# Patient Record
Sex: Male | Born: 1950 | Race: White | Hispanic: No | Marital: Married | Smoking: Never smoker
Health system: Southern US, Community
[De-identification: ages and names within clinical notes are randomized; demographics above are authoritative.]

## PROBLEM LIST (undated history)

## (undated) DIAGNOSIS — I471 Supraventricular tachycardia: Secondary | ICD-10-CM

## (undated) HISTORY — PX: EYE SURGERY: SHX253

## (undated) HISTORY — PX: HERNIA REPAIR: SHX51

---

## 1990-06-28 HISTORY — PX: FETAL SURGERY FOR CONGENITAL HERNIA: SHX1618

## 2010-06-28 DIAGNOSIS — I471 Supraventricular tachycardia, unspecified: Secondary | ICD-10-CM

## 2010-06-28 HISTORY — DX: Supraventricular tachycardia, unspecified: I47.10

## 2010-06-28 HISTORY — DX: Supraventricular tachycardia: I47.1

## 2014-07-29 DEATH — deceased

## 2016-06-17 ENCOUNTER — Emergency Department (HOSPITAL_COMMUNITY)
Admission: EM | Admit: 2016-06-17 | Discharge: 2016-06-17 | Disposition: A | Discharge status: Home / Self Care | Payer: Medicare Other | Attending: Emergency Medicine | Admitting: Emergency Medicine

## 2016-06-17 ENCOUNTER — Encounter (HOSPITAL_COMMUNITY): Payer: Self-pay

## 2016-06-17 DIAGNOSIS — I4891 Unspecified atrial fibrillation: Secondary | ICD-10-CM | POA: Diagnosis not present

## 2016-06-17 DIAGNOSIS — R000 Tachycardia, unspecified: Secondary | ICD-10-CM | POA: Diagnosis present

## 2016-06-17 HISTORY — DX: Supraventricular tachycardia: I47.1

## 2016-06-17 LAB — TSH: TSH: 1.417 u[IU]/mL (ref 0.350–4.500)

## 2016-06-17 LAB — COMPREHENSIVE METABOLIC PANEL
ALT: 19 U/L (ref 17–63)
ANION GAP: 11 (ref 5–15)
AST: 20 U/L (ref 15–41)
Albumin: 4.2 g/dL (ref 3.5–5.0)
Alkaline Phosphatase: 68 U/L (ref 38–126)
CHLORIDE: 99 mmol/L — AB (ref 101–111)
CO2: 28 mmol/L (ref 22–32)
Calcium: 9.8 mg/dL (ref 8.9–10.3)
Creatinine, Ser: 1.04 mg/dL (ref 0.61–1.24)
Glucose, Bld: 110 mg/dL — ABNORMAL HIGH (ref 65–99)
POTASSIUM: 3.8 mmol/L (ref 3.5–5.1)
Sodium: 138 mmol/L (ref 135–145)
Total Bilirubin: 0.9 mg/dL (ref 0.3–1.2)
Total Protein: 6.9 g/dL (ref 6.5–8.1)

## 2016-06-17 LAB — CBC WITH DIFFERENTIAL/PLATELET
BASOS ABS: 0 10*3/uL (ref 0.0–0.1)
Basophils Relative: 0 %
EOS PCT: 0 %
Eosinophils Absolute: 0 10*3/uL (ref 0.0–0.7)
HCT: 41.9 % (ref 39.0–52.0)
Hemoglobin: 14.1 g/dL (ref 13.0–17.0)
LYMPHS PCT: 10 %
Lymphs Abs: 0.8 10*3/uL (ref 0.7–4.0)
MCH: 32.4 pg (ref 26.0–34.0)
MCHC: 33.7 g/dL (ref 30.0–36.0)
MCV: 96.3 fL (ref 78.0–100.0)
MONO ABS: 1.2 10*3/uL — AB (ref 0.1–1.0)
Monocytes Relative: 17 %
Neutro Abs: 5.2 10*3/uL (ref 1.7–7.7)
Neutrophils Relative %: 73 %
PLATELETS: 176 10*3/uL (ref 150–400)
RBC: 4.35 MIL/uL (ref 4.22–5.81)
RDW: 12.6 % (ref 11.5–15.5)
WBC: 7.2 10*3/uL (ref 4.0–10.5)

## 2016-06-17 LAB — MAGNESIUM: MAGNESIUM: 2 mg/dL (ref 1.7–2.4)

## 2016-06-17 MED ORDER — APIXABAN 5 MG PO TABS: 5.0000 mg | ORAL_TABLET | Freq: Two times a day (BID) | ORAL | 0 refills | Status: DC

## 2016-06-17 MED ORDER — APIXABAN 5 MG PO TABS
5.0000 mg | ORAL_TABLET | Freq: Two times a day (BID) | ORAL | Status: DC
  Filled 2016-06-17: qty 1

## 2016-06-17 MED ORDER — METOPROLOL TARTRATE 5 MG/5ML IV SOLN
2.5000 mg | Freq: Once | INTRAVENOUS | Status: AC
  Administered 2016-06-17: 2.5 mg via INTRAVENOUS
  Filled 2016-06-17: qty 5

## 2016-06-17 MED ORDER — SODIUM CHLORIDE 0.9 % IV BOLUS (SEPSIS)
1000.0000 mL | Freq: Once | INTRAVENOUS | Status: AC
  Administered 2016-06-17: 1000 mL via INTRAVENOUS

## 2016-06-17 MED ORDER — APIXABAN 5 MG PO TABS
5.0000 mg | ORAL_TABLET | Freq: Two times a day (BID) | ORAL | Status: DC
  Administered 2016-06-17: 5 mg via ORAL
  Filled 2016-06-17: qty 1

## 2016-06-17 NOTE — ED Triage Notes (Signed)
Per EMS: Pt with history of SVT started feeling "palpitations" yesterday. Wife, who is medical provider check heart pulse and noted irregular heartbeat. Today no change noted. Called 911.

## 2016-06-17 NOTE — Discharge Instructions (Signed)
You have a follow-up appointment at the atrial fibrillation clinic.  [facility] at [facility] [address] [place],  [place]  [id number] Main: [phone]  Your appointment is for 9:30 tomorrow morning. Please call the office number so that they can give you more detailed instructions of [facility] and were to go.  You're started on a new blood thinner medications for stroke prevention while you have this ongoing rhythm. They will further addressed your heart rate control and management at your appointment tomorrow.  Continue taking your metoprolol as scheduled, including your dose tonight  Return without fail for worsening symptoms, including difficulty breathing, passing out, chest pain. We discussed that there are some increased risk for bleeding well on this blood thinner. Please have evaluation in the hospital for any head injury on this medication, bloody stools or black stools, or any other symptoms concerning to you.

## 2016-06-17 NOTE — ED Provider Notes (Signed)
I saw and evaluated the patient, reviewed the resident's note and I agree with the findings and plan.   EKG Interpretation  Date/Time:  Thursday June 17 2016 14:23:16 EST Ventricular Rate:  115 PR Interval:    QRS Duration: 104 QT Interval:  320 QTC Calculation: 443 R Axis:   -81 Text Interpretation:  Age not entered, assumed to be  65 years old for purpose of ECG interpretation Atrial fibrillation Left anterior fascicular block Anterior infarct, old no prior history of atrial fibrillation  Confirmed by Areana Kosanke MD, Lala Been [phone]) on 06/17/2016 2:34:4735 PM      65 year old male who presents with palpitations and weakness. States history of SVT well controlled by metoprolol. Has been dealing with sinus infection and last night took albuterol inhaler and single dose of cough medication. Did feel like he was weak and had palpitations and irregular heart beat at around 8PM. Took dose of metoprolol, heart beat slowed down. This morning with persistent symptoms even after taking metoprolol. No chest pain or difficulty breathing. No leg swelling, leg pain, fever or chills. Does state occasional palpations here and there in the past.   On arrival with atrial fibrllation with RVR at rate 110-140s. Low BP 90-100s SBP but he states that it is his baseline. Exam otherwise non-focal. No major electrolyte or metabolic derangements. Normal TSH. Did receive 2.5 mg IV metoprolol and adequately rate controlled and he appears well. HR 90-110. No need for IV drip and I feel he is appropriate for outpatient management. Not great candidate for cardioversion has had occasional palpitations in the past.  Appointment made for him for tomorrow at 9:30AM for Afib clinic. Discussed with Dr. Elease HashimotoNahser from cardiology. Agrees with outpatient management. Given Chads2-vasc score of 1 recommending Eliquis, which we will start. Strict return and follow-up instructions reviewed. Patient and his wife expressed understanding of all discharge  instructions and felt comfortable with the plan of care.    Lavera Guiseana Duo Janijah Symons, MD 06/17/16 [phone]

## 2016-06-17 NOTE — ED Provider Notes (Signed)
[facility]-EMERGENCY DEPT Provider Note   CSN: [id number] Arrival date & time: 06/17/16  1413     History   Chief Complaint Chief Complaint  Patient presents with  . Weakness  . Irregular Heart Beat    HPI Darius Williamson is a 65 y.o. male.  HPI The patient has a h/o SVT (with no issues on metoprolol for several years) presenting with tachycardia and an irregular heart beat.  He notes palpitations are constant since last night. No chest pain. He tried valsalva which didn't help. He's been taking metoprolol 25mg  BID without improvement in his symptoms. He denies SOB, LE swelling.   The patient has been trying to get rid of a sinus infection and took cough medication, Omnicef, and 1 dose of Tamiflu which he attributes to his current symptoms.   He is unsure if he's had palpitations before yesterday as he tends to brush off symptoms. 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   PCP: [facility] (has only had 1 appt)  Past Medical History:  Diagnosis Date  . SVT (supraventricular tachycardia) (HCC) 2012    There are no active problems to display for this patient.   Past Surgical History:  Procedure Laterality Date  . EYE SURGERY    . HERNIA REPAIR       Home Medications    Prior to Admission medications   Not on File  metoprolol tartrate 25mg  BID  Family History No family history on file.  Social History Social History  Substance Use Topics  . Smoking status: Never Smoker  . Smokeless tobacco: Never Used  . Alcohol use 0.6 oz/week    1 Glasses of wine per week     Allergies   Patient has no known allergies.   Review of Systems Review of Systems  Constitutional: Negative for activity change, appetite change, chills and fever.  HENT: Positive for congestion, postnasal drip, sinus pain and sinus pressure. Negative for ear discharge, rhinorrhea, sore throat and trouble swallowing.   Eyes: Negative.   Respiratory: Positive for cough. Negative  for shortness of breath, wheezing and stridor.   Cardiovascular: Positive for palpitations. Negative for chest pain and leg swelling.  Musculoskeletal: Positive for neck stiffness.  Skin: Negative for rash.  Neurological: Positive for headaches.  All other systems reviewed and are negative.    Physical Exam Updated Vital Signs BP 104/65   Pulse 96   Resp 21   Ht 5\' 7"  (1.702 m)   Wt 59 kg   SpO2 99%   BMI 20.36 kg/m   Physical Exam  Constitutional: He is oriented to person, place, and time. He appears well-developed and well-nourished. No distress.  HENT:  Head: Normocephalic and atraumatic.  Right Ear: External ear normal.  Left Ear: External ear normal.  Nose: Nose normal.  Mouth/Throat: Oropharynx is clear and moist. No oropharyngeal exudate.  Eyes: Conjunctivae are normal. Right eye exhibits no discharge. Left eye exhibits no discharge. No scleral icterus.  Neck: Normal range of motion. Neck supple. No JVD present.  Cardiovascular: Normal heart sounds and intact distal pulses.  Exam reveals no gallop and no friction rub.   No murmur heard. Tachycardic, irregular rhythm  Pulmonary/Chest: Effort normal and breath sounds normal. No respiratory distress. He has no wheezes. He has no rales. He exhibits no tenderness.  Abdominal: Soft. Bowel sounds are normal. He exhibits no distension. There is no tenderness.  Musculoskeletal: He exhibits no edema or tenderness.  Lymphadenopathy:    He  has no cervical adenopathy.  Neurological: He is alert and oriented to person, place, and time. He exhibits normal muscle tone.  Skin: Skin is warm. Capillary refill takes less than 2 seconds. No rash noted. He is not diaphoretic.  Psychiatric: He has a normal mood and affect. His behavior is normal.     ED Treatments / Results  Labs (all labs ordered are listed, but only abnormal results are displayed) Labs Reviewed  CBC WITH DIFFERENTIAL/PLATELET  COMPREHENSIVE METABOLIC PANEL  TSH    MAGNESIUM    EKG  EKG Interpretation  Date/Time:  Thursday June 17 2016 14:23:16 EST Ventricular Rate:  115 PR Interval:    QRS Duration: 104 QT Interval:  320 QTC Calculation: 443 R Axis:   -81 Text Interpretation:  Age not entered, assumed to be  65 years old for purpose of ECG interpretation Atrial fibrillation Left anterior fascicular block Anterior infarct, old no prior history of atrial fibrillation  Confirmed by LIU MD, DANA ([id number]) on 06/17/2016 2:34:35 PM       Radiology No results found.  Procedures Procedures (including critical care time)  Medications Ordered in ED Medications  metoprolol (LOPRESSOR) injection 2.5 mg (not administered)  sodium chloride 0.9 % bolus 1,000 mL (not administered)     Initial Impression / Assessment and Plan / ED Course  I have reviewed the triage vital signs and the nursing notes.  Pertinent labs & imaging results that were available during my care of the patient were reviewed by me and considered in my medical decision making (see chart for details).  Clinical Course    1500: patient noted to be in atrial fibrillation with HR in the 110-140s. Will get CMET, TSH, magnesium and CBC. No chest pain, therefore will defer troponin. Will give a 1 time dose of metoprolol 2.5mg  and evaluate response. CHADVASc 2 score is 1 which is low/moderate risk. Will consider outpatient anticoagulation as this is new onset afib.  If we are able to rate control him in the ED, he may be able to f/u with afib clinic tomorrow at 9:30AM. The patient was discussed with EDP attending, Dr. Joni FearsLui, who will resume care.   Final Clinical Impressions(s) / ED Diagnoses   Final diagnoses:  Atrial fibrillation with RVR [facility](HCC)    New Prescriptions New Prescriptions   No medications on file     Joanna Puffrystal S Friend Dorfman, MD 06/17/16 1518    Lavera Guiseana Duo Liu, MD 06/18/16 1109

## 2016-06-18 ENCOUNTER — Encounter (HOSPITAL_COMMUNITY): Payer: Self-pay | Admitting: Nurse Practitioner

## 2016-06-18 ENCOUNTER — Ambulatory Visit (HOSPITAL_COMMUNITY)
Admit: 2016-06-18 | Discharge: 2016-06-18 | Disposition: A | Discharge status: Home / Self Care | Payer: Medicare Other | Source: Ambulatory Visit | Attending: Nurse Practitioner | Admitting: Nurse Practitioner

## 2016-06-18 VITALS — BP 122/74 | HR 65 | Ht 67.0 in | Wt 133.2 lb

## 2016-06-18 DIAGNOSIS — Z79899 Other long term (current) drug therapy: Secondary | ICD-10-CM | POA: Diagnosis not present

## 2016-06-18 DIAGNOSIS — I480 Paroxysmal atrial fibrillation: Secondary | ICD-10-CM | POA: Insufficient documentation

## 2016-06-18 NOTE — Patient Instructions (Signed)
Your physician has recommended you make the following change in your medication: 1) May take an extra metoprolol as needed for afib

## 2016-06-18 NOTE — Progress Notes (Signed)
Primary Care Physician: Peter CongoBarden, [name], PA-C Referring Physician: [facility]   Linda HedgesWilliam Williamson is a 65 y.o. male in the afib clinic for evaluation. He was seen in the ER for new onset afib that occurred after using albuterol for URI symptoms. He did leave the ER in rate controlled afib and did convert shortly afterward. He was started on eliquis 5 mg bid for a CHA2DS2VASc score of 1(age).   Review of lifestyle shows that pt is of normal weight, he exercises regularly, no smoking or alcohol, minimal caffeine. No snoring.   Today, he denies symptoms of palpitations, chest pain, shortness of breath, orthopnea, PND, lower extremity edema, dizziness, presyncope, syncope, or neurologic sequela. The patient is tolerating medications without difficulties and is otherwise without complaint today.   Past Medical History:  Diagnosis Date  . SVT (supraventricular tachycardia) (HCC) 2012   Past Surgical History:  Procedure Laterality Date  . EYE SURGERY    . HERNIA REPAIR      Current Outpatient Prescriptions  Medication Sig Dispense Refill  . apixaban (ELIQUIS) 5 MG TABS tablet Take 1 tablet (5 mg total) by mouth 2 (two) times daily. 60 tablet 0  . cefdinir (OMNICEF) 300 MG capsule Take 300 mg by mouth 2 (two) times daily.    . Cholecalciferol (VITAMIN D PO) Take 1 tablet by mouth daily.    Marland Kitchen. HYDROcodone-homatropine (HYCODAN) 5-1.5 MG/5ML syrup Take 5 mLs by mouth every 6 (six) hours as needed for cough.    . metoprolol tartrate (LOPRESSOR) 25 MG tablet Take 25 mg by mouth 2 (two) times daily.    . sildenafil (REVATIO) 20 MG tablet Take 20 mg by mouth daily as needed. Erectile dysfunction     No current facility-administered medications for this encounter.     No Known Allergies  Social History   Social History  . Marital status: Married    Spouse name: N/A  . Number of children: N/A  . Years of education: N/A   Occupational History  . Not on file.   Social History Main Topics  .  Smoking status: Never Smoker  . Smokeless tobacco: Never Used  . Alcohol use 0.6 oz/week    1 Glasses of wine per week  . Drug use: No  . Sexual activity: Not on file   Other Topics Concern  . Not on file   Social History Narrative  . No narrative on file    No family history on file.  ROS- All systems are reviewed and negative except as per the HPI above  Physical Exam: Vitals:   06/18/16 0946  BP: 122/74  Pulse: 65  Weight: 133 lb 3.2 oz (60.4 kg)  Height: 5\' 7"  (1.702 m)   Wt Readings from Last 3 Encounters:  06/18/16 133 lb 3.2 oz (60.4 kg)  06/17/16 130 lb (59 kg)    Labs: Lab Results  Component Value Date   NA 138 06/17/2016   K 3.8 06/17/2016   CL 99 (L) 06/17/2016   CO2 28 06/17/2016   GLUCOSE 110 (H) 06/17/2016   BUN <5 (L) 06/17/2016   CREATININE 1.04 06/17/2016   CALCIUM 9.8 06/17/2016   MG 2.0 06/17/2016   No results found for: INR No results found for: CHOL, HDL, LDLCALC, TRIG   GEN- The patient is well appearing, alert and oriented x 3 today.   Head- normocephalic, atraumatic Eyes-  Sclera clear, conjunctiva pink Ears- hearing intact Oropharynx- clear Neck- supple, no JVP Lymph- no cervical lymphadenopathy  Lungs- Clear to ausculation bilaterally, normal work of breathing Heart- Regular rate and rhythm, no murmurs, rubs or gallops, PMI not laterally displaced GI- soft, NT, ND, + BS Extremities- no clubbing, cyanosis, or edema MS- no significant deformity or atrophy Skin- no rash or lesion Psych- euthymic mood, full affect Neuro- strength and sensation are intact  EKG-NSR at 65 bpm, pr int 196 ms, qrs int 94 ms, qtc 407 ms Epic records reviewed   Assessment and Plan: 1. Paroxysmal afib Now back in SR Will take blood thinners for 30 days and if no recurrent afib , can stop, for CHA2DS2VASc socre of 1(age) General afib education was given and triggers reviewed Echo ordered  F/u in afib clinic after echo  Lupita LeashDonna C. Matthew Folksarroll,  ANP-C Afib Clinic [facility] [address] [place], [place] [address] [phone]

## 2016-06-30 ENCOUNTER — Ambulatory Visit (HOSPITAL_COMMUNITY)
Admission: RE | Admit: 2016-06-30 | Discharge: 2016-06-30 | Disposition: A | Discharge status: Home / Self Care | Payer: Medicare Other | Source: Ambulatory Visit | Attending: Nurse Practitioner | Admitting: Nurse Practitioner

## 2016-06-30 DIAGNOSIS — I480 Paroxysmal atrial fibrillation: Secondary | ICD-10-CM | POA: Insufficient documentation

## 2016-06-30 NOTE — Progress Notes (Signed)
  Echocardiogram 2D Echocardiogram has been performed.  Darius Williamson 06/30/2016, 3:08 PM

## 2016-07-06 ENCOUNTER — Ambulatory Visit (HOSPITAL_COMMUNITY)
Admission: RE | Admit: 2016-07-06 | Discharge: 2016-07-06 | Disposition: A | Discharge status: Home / Self Care | Payer: Medicare Other | Source: Ambulatory Visit | Attending: Nurse Practitioner | Admitting: Nurse Practitioner

## 2016-07-06 ENCOUNTER — Encounter (HOSPITAL_COMMUNITY): Payer: Self-pay | Admitting: Nurse Practitioner

## 2016-07-06 VITALS — BP 124/70 | HR 55 | Ht 67.0 in | Wt 131.6 lb

## 2016-07-06 DIAGNOSIS — Z79899 Other long term (current) drug therapy: Secondary | ICD-10-CM | POA: Diagnosis not present

## 2016-07-06 DIAGNOSIS — I480 Paroxysmal atrial fibrillation: Secondary | ICD-10-CM | POA: Diagnosis not present

## 2016-07-06 DIAGNOSIS — Z7901 Long term (current) use of anticoagulants: Secondary | ICD-10-CM | POA: Insufficient documentation

## 2016-07-06 DIAGNOSIS — R001 Bradycardia, unspecified: Secondary | ICD-10-CM | POA: Diagnosis present

## 2016-07-06 NOTE — Progress Notes (Signed)
Primary Care Physician: Peter Congo Referring Physician: [facility]   Darius Williamson is a 66 y.o. male in the afib clinic for evaluation. He was seen in the ER for new onset afib that occurred after using albuterol for URI symptoms. He did leave the ER in rate controlled afib and did convert shortly afterward, in afib less than 24 hours.. He was started on eliquis 5 mg bid for a CHA2DS2VASc score of 1(age). Review of lifestyle shows that pt is of normal weight, he exercises regularly, no smoking or alcohol, minimal caffeine. No snoring.  He returns today. 1/9, for f/u  Echo which showed normal EF and no structural abnormalities. He has had not had any further episodes of afib. He will stop Doac today since he has a chadsvasc of 1, afib less than 24 hours, and no recurrent afib.  Today, he denies symptoms of palpitations, chest pain, shortness of breath, orthopnea, PND, lower extremity edema, dizziness, presyncope, syncope, or neurologic sequela. The patient is tolerating medications without difficulties and is otherwise without complaint today.   Past Medical History:  Diagnosis Date  . SVT (supraventricular tachycardia) (HCC) 2012   Past Surgical History:  Procedure Laterality Date  . EYE SURGERY    . HERNIA REPAIR      Current Outpatient Prescriptions  Medication Sig Dispense Refill  . apixaban (ELIQUIS) 5 MG TABS tablet Take 1 tablet (5 mg total) by mouth 2 (two) times daily. 60 tablet 0  . Cholecalciferol (VITAMIN D PO) Take 1 tablet by mouth daily.    . fexofenadine (ALLEGRA) 180 MG tablet Take 180 mg by mouth daily.    . metoprolol tartrate (LOPRESSOR) 25 MG tablet Take 25 mg by mouth 2 (two) times daily.    . sildenafil (REVATIO) 20 MG tablet Take 20 mg by mouth daily as needed. Erectile dysfunction     No current facility-administered medications for this encounter.     No Known Allergies  Social History   Social History  . Marital status: Married    Spouse name:  N/A  . Number of children: N/A  . Years of education: N/A   Occupational History  . Not on file.   Social History Main Topics  . Smoking status: Never Smoker  . Smokeless tobacco: Never Used  . Alcohol use 0.6 oz/week    1 Glasses of wine per week  . Drug use: No  . Sexual activity: Not on file   Other Topics Concern  . Not on file   Social History Narrative  . No narrative on file    No family history on file.  ROS- All systems are reviewed and negative except as per the HPI above  Physical Exam: Vitals:   07/06/16 1334  BP: 124/70  Pulse: (!) 55  Weight: 131 lb 9.6 oz (59.7 kg)  Height: 5\' 7"  (1.702 m)   Wt Readings from Last 3 Encounters:  07/06/16 131 lb 9.6 oz (59.7 kg)  06/18/16 133 lb 3.2 oz (60.4 kg)  06/17/16 130 lb (59 kg)    Labs: Lab Results  Component Value Date   NA 138 06/17/2016   K 3.8 06/17/2016   CL 99 (L) 06/17/2016   CO2 28 06/17/2016   GLUCOSE 110 (H) 06/17/2016   BUN <5 (L) 06/17/2016   CREATININE 1.04 06/17/2016   CALCIUM 9.8 06/17/2016   MG 2.0 06/17/2016   No results found for: INR No results found for: CHOL, HDL, LDLCALC, TRIG   GEN-  The patient is well appearing, alert and oriented x 3 today.   Head- normocephalic, atraumatic Eyes-  Sclera clear, conjunctiva pink Ears- hearing intact Oropharynx- clear Neck- supple, no JVP Lymph- no cervical lymphadenopathy Lungs- Clear to ausculation bilaterally, normal work of breathing Heart- Regular rate and rhythm, no murmurs, rubs or gallops, PMI not laterally displaced GI- soft, NT, ND, + BS Extremities- no clubbing, cyanosis, or edema MS- no significant deformity or atrophy Skin- no rash or lesion Psych- euthymic mood, full affect Neuro- strength and sensation are intact  EKG- Sinus brady at 55 bpm with first degree AV block, pr int 226ms, qrs itn 102 ms, qtc 413 ms Epic records reviewed Echo-Study Conclusions  - Left ventricle: The cavity size was normal. Systolic  function was   normal. The estimated ejection fraction was in the range of 55%   to 60%. Wall motion was normal; there were no regional wall   motion abnormalities. Left ventricular diastolic function   parameters were normal.   Assessment and Plan: 1. Paroxysmal afib for less than 24 hours, converted spontaneously Possibly induced by use of albuterol Maintaining SR Pt would like to stop DOAC and with low chadsvasc score  for  of 1(age), can stop, took x 19 days General afib education was given and triggers reviewed Echo  Reviewed with pt  F/u in afib clinic as needed  Lupita LeashDonna C. Matthew Folksarroll, ANP-C Afib Clinic [facility] [address] [place], [place] [address] [phone]

## 2021-01-06 ENCOUNTER — Other Ambulatory Visit: Payer: Self-pay

## 2021-01-06 ENCOUNTER — Ambulatory Visit (HOSPITAL_COMMUNITY)
Admission: RE | Admit: 2021-01-06 | Discharge: 2021-01-06 | Disposition: A | Discharge status: Home / Self Care | Payer: Medicare Other | Source: Ambulatory Visit | Attending: Physician Assistant | Admitting: Physician Assistant

## 2021-01-06 ENCOUNTER — Encounter (HOSPITAL_COMMUNITY): Payer: Self-pay | Admitting: Physician Assistant

## 2021-01-06 VITALS — BP 110/60 | HR 86 | Ht 67.0 in | Wt 130.2 lb

## 2021-01-06 DIAGNOSIS — Z79899 Other long term (current) drug therapy: Secondary | ICD-10-CM | POA: Diagnosis not present

## 2021-01-06 DIAGNOSIS — I4819 Other persistent atrial fibrillation: Secondary | ICD-10-CM | POA: Diagnosis present

## 2021-01-06 DIAGNOSIS — Z7901 Long term (current) use of anticoagulants: Secondary | ICD-10-CM | POA: Insufficient documentation

## 2021-01-06 MED ORDER — APIXABAN 5 MG PO TABS
5.0000 mg | ORAL_TABLET | Freq: Two times a day (BID) | ORAL | 3 refills | Status: DC
Start: 2021-01-06 — End: 2021-04-01

## 2021-01-06 NOTE — Progress Notes (Signed)
Primary Care Physician: Romie Jumper, PA-C Primary Cardiologist: none Primary Electrophysiologist: none Referring Physician: [facility]   Darius Williamson is a 70 y.o. male with a history of atrial fibrillation who presents for consultation in the [facility].  The patient was initially diagnosed with atrial fibrillation in 2017 after presenting to ED with symptoms of palpitations and weakness. He has a CHADS2VASC score of 1. Over the years, he has had infrequent afib which lasted < 1 day. But lately his afib has been more persistent, the longest lasting several weeks. He reports today that this present episode started 12/17/20. There are no specific triggers that he can identify. He denies significant snoring or alcohol use. He does have symptoms of palpitations and fatigue while in afib.   Today, he denies symptoms of chest pain, shortness of breath, orthopnea, PND, lower extremity edema, dizziness, presyncope, syncope, snoring, daytime somnolence, bleeding, or neurologic sequela. The patient is tolerating medications without difficulties and is otherwise without complaint today.    Atrial Fibrillation Risk Factors:  he does not have symptoms or diagnosis of sleep apnea. he does not have a history of rheumatic fever. he does not have a history of alcohol use. The patient does not have a history of early familial atrial fibrillation or other arrhythmias.  he has a BMI of Body mass index is 20.39 kg/m.Marland Kitchen Filed Weights   01/06/21 1027  Weight: 59.1 kg    History reviewed. No pertinent family history.   Atrial Fibrillation Management history:  Previous antiarrhythmic drugs: none Previous cardioversions: none Previous ablations: none CHADS2VASC score: 1 Anticoagulation history: Eliquis   Past Medical History:  Diagnosis Date   SVT (supraventricular tachycardia) (HCC) 2012   Past Surgical History:  Procedure Laterality Date   EYE SURGERY      HERNIA REPAIR      Current Outpatient Medications  Medication Sig Dispense Refill   ascorbic acid (VITAMIN C) 1000 MG tablet Take by mouth.     Cholecalciferol 100 MCG (4000 UT) CAPS Take 5,000 Units by mouth.     fexofenadine (ALLEGRA) 180 MG tablet Take 180 mg by mouth daily.     Magnesium 250 MG TABS Take by mouth.     metoprolol tartrate (LOPRESSOR) 25 MG tablet Take 25 mg by mouth 2 (two) times daily.     Multiple Vitamin (MULTI-VITAMIN) tablet Take by mouth.     sildenafil (REVATIO) 20 MG tablet Take 20 mg by mouth daily as needed. Erectile dysfunction     apixaban (ELIQUIS) 5 MG TABS tablet Take 1 tablet (5 mg total) by mouth 2 (two) times daily. 60 tablet 3   No current facility-administered medications for this encounter.    No Known Allergies  Social History   Socioeconomic History   Marital status: Married    Spouse name: Not on file   Number of children: Not on file   Years of education: Not on file   Highest education level: Not on file  Occupational History   Not on file  Tobacco Use   Smoking status: Never   Smokeless tobacco: Never  Substance and Sexual Activity   Alcohol use: Not Currently    Alcohol/week: 2.0 standard drinks    Types: 2 Cans of beer per week    Comment: 2 beers a week   Drug use: No   Sexual activity: Not on file  Other Topics Concern   Not on file  Social History Narrative   Not  on file   Social Determinants of Health   Financial Resource Strain: Not on file  Food Insecurity: Not on file  Transportation Needs: Not on file  Physical Activity: Not on file  Stress: Not on file  Social Connections: Not on file  Intimate Partner Violence: Not on file     ROS- All systems are reviewed and negative except as per the HPI above.  Physical Exam: Vitals:   01/06/21 1027  BP: 110/60  Pulse: 86  Weight: 59.1 kg  Height: 5\' 7"  (1.702 m)    GEN- The patient is a well appearing male, alert and oriented x 3 today.   Head-  normocephalic, atraumatic Eyes-  Sclera clear, conjunctiva pink Ears- hearing intact Oropharynx- clear Neck- supple  Lungs- Clear to ausculation bilaterally, normal work of breathing Heart- irregular rate and rhythm, no murmurs, rubs or gallops  GI- soft, NT, ND, + BS Extremities- no clubbing, cyanosis, or edema MS- no significant deformity or atrophy Skin- no rash or lesion Psych- euthymic mood, full affect Neuro- strength and sensation are intact  Wt Readings from Last 3 Encounters:  01/06/21 59.1 kg  07/06/16 59.7 kg  06/18/16 60.4 kg    EKG today demonstrates  Afib, LAFB Vent. rate 86 BPM PR interval * ms QRS duration 92 ms QT/QTcB 368/440 ms  Echo 06/30/16 demonstrated  - Left ventricle: The cavity size was normal. Systolic function was    normal. The estimated ejection fraction was in the range of 55%    to 60%. Wall motion was normal; there were no regional wall    motion abnormalities. Left ventricular diastolic function    parameters were normal.   Epic records are reviewed at length today  CHA2DS2-VASc Score = 1  The patient's score is based upon: CHF History: No HTN History: No Diabetes History: No Stroke History: No Vascular Disease History: No Age Score: 1 Gender Score: 0     ASSESSMENT AND PLAN: 1. Persistent Atrial Fibrillation (ICD10:  I48.19) The patient's CHA2DS2-VASc score is 1, indicating a 0.6% annual risk of stroke.   General education about afib provided and questions answered. We also discussed his stroke risk and the risks and benefits of anticoagulation. Patient now appears persistent. We discussed therapeutic options today including AAD, DCCV, and ablation. He declines DCCV. He is agreeable to referral for front-line ablation.  Will start Eliquis 5 mg BID, stop ASA Continue Lopressor 25 mg BID    Follow up with EP for ablation consideration.    08/28/16 PA-C Afib Clinic [facility] [address] [place],  [place] [phone] 01/06/2021 11:29 AM

## 2021-01-06 NOTE — Patient Instructions (Signed)
Stop aspirin  Start Eliquis 5 mg twice a day.

## 2021-01-18 ENCOUNTER — Emergency Department (HOSPITAL_COMMUNITY): Payer: Medicare Other

## 2021-01-18 ENCOUNTER — Emergency Department (HOSPITAL_COMMUNITY)
Admission: EM | Admit: 2021-01-18 | Discharge: 2021-01-19 | Disposition: A | Discharge status: Home / Self Care | Payer: Medicare Other | Attending: Emergency Medicine | Admitting: Emergency Medicine

## 2021-01-18 DIAGNOSIS — Z5321 Procedure and treatment not carried out due to patient leaving prior to being seen by health care provider: Secondary | ICD-10-CM | POA: Insufficient documentation

## 2021-01-18 DIAGNOSIS — Z7901 Long term (current) use of anticoagulants: Secondary | ICD-10-CM | POA: Insufficient documentation

## 2021-01-18 DIAGNOSIS — I4891 Unspecified atrial fibrillation: Secondary | ICD-10-CM | POA: Diagnosis not present

## 2021-01-18 LAB — CBC
HCT: 45.4 % (ref 39.0–52.0)
Hemoglobin: 14.9 g/dL (ref 13.0–17.0)
MCH: 32.7 pg (ref 26.0–34.0)
MCHC: 32.8 g/dL (ref 30.0–36.0)
MCV: 99.6 fL (ref 80.0–100.0)
Platelets: 221 10*3/uL (ref 150–400)
RBC: 4.56 MIL/uL (ref 4.22–5.81)
RDW: 12.5 % (ref 11.5–15.5)
WBC: 6.6 10*3/uL (ref 4.0–10.5)
nRBC: 0 % (ref 0.0–0.2)

## 2021-01-18 LAB — BASIC METABOLIC PANEL
Anion gap: 8 (ref 5–15)
BUN: 10 mg/dL (ref 8–23)
CO2: 29 mmol/L (ref 22–32)
Calcium: 10.2 mg/dL (ref 8.9–10.3)
Chloride: 100 mmol/L (ref 98–111)
Creatinine, Ser: 1.23 mg/dL (ref 0.61–1.24)
GFR, Estimated: >60 mL/min (ref >60)
Glucose, Bld: 84 mg/dL (ref 70–99)
Potassium: 5.1 mmol/L (ref 3.5–5.1)
Sodium: 137 mmol/L (ref 135–145)

## 2021-01-18 NOTE — ED Provider Notes (Signed)
Emergency Medicine Provider Triage Evaluation Note  Darius Williamson , a 70 y.o. male  was evaluated in triage.  Pt complains of episode of heart racing tonight.  Patient has a history of A. fib, has been intermittent until more recently where it has been more persistent.  Patient followed up with his cardiologist about 2 weeks ago and restarted his Eliquis, has been consistently taking since that time with no missed doses.  Also taking metoprolol.  Patient had an episode tonight when his heart started racing right after dinner, he took an extra metoprolol which seemed to help for about 5 minutes and then felt his heart was racing again.  Did not check his rate during this time.  Patient decided to call an ambulance when he felt short of breath and like he was about to pass out.  No loss of consciousness, no chest pain.  Review of Systems  Positive: SHOB, palpitations  Negative: Syncope, CP  Physical Exam  BP 111/76 (BP Location: Right Arm)   Pulse 82   Temp 98.8 F (37.1 C) (Oral)   Resp 18   SpO2 100%  Gen:   Awake, no distress   Resp:  Normal effort  MSK:   Moves extremities without difficulty  Other:  Irregular rate  Medical Decision Making  Medically screening exam initiated at 10:36 PM.  Appropriate orders placed.  Darius Williamson was informed that the remainder of the evaluation will be completed by another provider, this initial triage assessment does not replace that evaluation, and the importance of remaining in the ED until their evaluation is complete.     Darius Fend, PA-C 01/18/21 2243    [name], MD 01/18/21 [phone]

## 2021-01-18 NOTE — ED Triage Notes (Signed)
Pt from home and was watching TV tonight when he started feeling palpitations. Hx of afib, has had preliminary conversation about ablation. Pt took his normal dose of metoprolol and then an additional dose when his heart rate became increased. Pt also on Eliquis

## 2021-01-19 ENCOUNTER — Ambulatory Visit (HOSPITAL_COMMUNITY): Payer: Medicare Other | Admitting: Physician Assistant

## 2021-01-19 NOTE — ED Notes (Signed)
Pt not responding to vital recheck  

## 2021-01-19 NOTE — ED Notes (Signed)
Pt not responding for vital recheck. 

## 2021-01-19 NOTE — Progress Notes (Signed)
Electrophysiology Office Note   Date:  01/20/2021   ID:  Darius Williamson, DOB Jul 12, 1950, MRN [id number]  PCP:  Randel Pigg, Dorma Russell, MD  Cardiologist:   Primary Electrophysiologist:  Min Collymore Jorja Loa, MD    Chief Complaint: AF   History of Present Illness: Darius Williamson is a 70 y.o. male who is being seen today for the evaluation of AF at the request of Barden, [name], PA-C. Presenting today for electrophysiology evaluation.  He has a history significant for atrial fibrillation.  He presented initially in 2017 to the emergency room with palpitations and weakness.  Over the years he has had infrequent episodes, but more recently, episodes have become persistent, lasting for several weeks.  There are no specific triggers that he can identify.  He denies significant alcohol abuse or snoring.  He has symptoms of palpitations and fatigue when he is in atrial fibrillation.  Today, he denies symptoms of palpitations, chest pain, orthopnea, PND, lower extremity edema, claudication, dizziness, presyncope, syncope, bleeding, or neurologic sequela. The patient is tolerating medications without difficulties.  His atrial fibrillation symptoms are weakness, fatigue, shortness of breath, and palpitations.  At this point, he would like to avoid further medications and would prefer ablation.   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 60 tablet 3   ascorbic acid (VITAMIN C) 1000 MG tablet Take by mouth.     Cholecalciferol 100 MCG (4000 UT) CAPS Take 5,000 Units by mouth.     fexofenadine (ALLEGRA) 180 MG tablet Take 180 mg by mouth daily.      Magnesium 250 MG TABS Take by mouth.     metoprolol tartrate (LOPRESSOR) 25 MG tablet Take 25 mg by mouth 2 (two) times daily.     Multiple Vitamin (MULTI-VITAMIN) tablet Take by mouth.     sildenafil (REVATIO) 20 MG tablet Take 20 mg by mouth daily as needed. Erectile dysfunction     No current facility-administered medications for this visit.    Allergies:   Patient has no known allergies.   Social History:  The patient  reports that he has never smoked. He has never used smokeless tobacco. He reports previous alcohol use of about 2.0 standard drinks of alcohol per week. He reports that he does not use drugs.   Family History:  The patient's family history includes Stroke in his mother.    ROS:  Please see the history of present illness.   Otherwise, review of systems is positive for none.   All other systems are reviewed and negative.    PHYSICAL EXAM: VS:  BP 100/90 (BP Location: Left Arm, Patient Position: Sitting, Cuff Size: Normal)   Pulse 66   Ht 5\' 7"  (1.702 m)   Wt 131 lb (59.4 kg)   BMI 20.52 kg/m  , BMI Body mass index is 20.52 kg/m. GEN: Well nourished, well developed, in no acute distress  HEENT: normal  Neck: no JVD, carotid bruits, or masses Cardiac: irregular; no murmurs, rubs, or gallops,no edema  Respiratory:  clear to auscultation bilaterally, normal work of breathing GI: soft, nontender, nondistended, + BS MS:  no deformity or atrophy  Skin: warm and dry Neuro:  Strength and sensation are intact Psych: euthymic mood, full affect  EKG:  EKG is not ordered today. Personal review of the ekg ordered 01/18/21 shows atrial fibrillation  Recent Labs: 01/18/2021: BUN 10; Creatinine, Ser 1.23; Hemoglobin 14.9; Platelets 221; Potassium 5.1; Sodium 137    Lipid Panel  No results found for: CHOL, TRIG, HDL, CHOLHDL, VLDL, LDLCALC, LDLDIRECT   Wt Readings from Last 3 Encounters:  01/20/21 131 lb (59.4 kg)  01/06/21 130 lb 3.2 oz (59.1 kg)  07/06/16 131 lb 9.6  oz (59.7 kg)      Other studies Reviewed: Additional studies/ records that were reviewed today include: TTE 06/30/16  Review of the above records today demonstrates:  - Left ventricle: The cavity size was normal. Systolic function was    normal. The estimated ejection fraction was in the range of 55%    to 60%. Wall motion was normal; there were no regional wall    motion abnormalities. Left ventricular diastolic function    parameters were normal.  -Left atrium:  The atrium was normal in size.  ASSESSMENT AND PLAN:  1.  Persistent atrial fibrillation: CHA2DS2-VASc of 1.  Has been recently started on Eliquis and metoprolol.  He has had a discussion about antiarrhythmics versus ablation with A. fib clinic.  He would like to avoid antiarrhythmics.  We Lakeisa Heninger thus plan for ablation.  Risks and benefits of been discussed.  He understands these risks and is agreed to the procedure.  Risk, benefits, and alternatives to EP study and radiofrequency ablation for afib were also discussed in detail today. These risks include but are not limited to stroke, bleeding, vascular damage, tamponade, perforation, damage to the esophagus, lungs, and other structures, pulmonary vein stenosis, worsening renal function, and death. The patient understands these risk and wishes to proceed.  We [name] therefore proceed with catheter ablation at the next available time.  Carto, ICE, anesthesia are requested for the procedure.  Akeiba Axelson also obtain CT PV protocol prior to the procedure to exclude LAA thrombus and further evaluate atrial anatomy.   Current medicines are reviewed at length with the patient today.   The patient does not have concerns regarding his medicines.  The following changes were made today:  none  Labs/ tests ordered today include:  Orders Placed This Encounter  Procedures   CT CARDIAC MORPH/PULM VEIN W/CM&W/O CA SCORE   Basic metabolic panel   CBC      Disposition:   FU with Kayin Kettering 3  months  Signed, Atlee Villers Jorja Loa, MD  01/20/2021 9:54 AM     [facility] [address] [place] [address] [place] [address] [phone] (office) [phone] (fax)

## 2021-01-20 ENCOUNTER — Ambulatory Visit (INDEPENDENT_AMBULATORY_CARE_PROVIDER_SITE_OTHER): Payer: Medicare Other | Admitting: Cardiology

## 2021-01-20 ENCOUNTER — Other Ambulatory Visit: Payer: Self-pay

## 2021-01-20 ENCOUNTER — Encounter: Payer: Self-pay | Admitting: Cardiology

## 2021-01-20 VITALS — BP 100/90 | HR 66 | Ht 67.0 in | Wt 131.0 lb

## 2021-01-20 DIAGNOSIS — Z01818 Encounter for other preprocedural examination: Secondary | ICD-10-CM

## 2021-01-20 DIAGNOSIS — I4819 Other persistent atrial fibrillation: Secondary | ICD-10-CM | POA: Diagnosis not present

## 2021-01-20 DIAGNOSIS — Z01812 Encounter for preprocedural laboratory examination: Secondary | ICD-10-CM

## 2021-01-20 NOTE — Patient Instructions (Signed)
Medication Instructions:  Your physician recommends that you continue on your current medications as directed. Please refer to the Current Medication list given to you today.  *If you need a refill on your cardiac medications before your next appointment, please call your pharmacy*   Lab Work: Pre procedure labs at the [facility], between 8/15 - 8/26:  BMP & CBC.   You do NOT need to be fasting.    [address], [address]  If you have labs (blood work) drawn today and your tests are completely normal, you will receive your results only by: MyChart Message (if you have MyChart) OR A paper copy in the mail If you have any lab test that is abnormal or we need to change your treatment, we will call you to review the results.   Testing/Procedures: Your physician has requested that you have cardiac CT within 7 days PRIOR to your ablation. Cardiac computed tomography (CT) is a painless test that uses an x-ray machine to take clear, detailed pictures of your heart.  Please follow instruction below located under "other instructions". You will get a call from our office to schedule the date for this test.  Your physician has recommended that you have an ablation. Catheter ablation is a medical procedure used to treat some cardiac arrhythmias (irregular heartbeats). During catheter ablation, a long, thin, flexible tube is put into a blood vessel in your groin (upper thigh), or neck. This tube is called an ablation catheter. It is then guided to your heart through the blood vessel. Radio frequency waves destroy small areas of heart tissue where abnormal heartbeats may cause an arrhythmia to start. Please follow instruction below located under "other instructions".   Follow-Up: At [facility], you and your health needs are our priority.  As part of our continuing mission to provide you with exceptional heart care, we have created designated Provider Care Teams.  These Care Teams  include your primary Cardiologist (physician) and Advanced Practice Providers (APPs -  Physician Assistants and Nurse Practitioners) who all work together to provide you with the care you need, when you need it.  We recommend signing up for the patient portal called "MyChart".  Sign up information is provided on this After Visit Summary.  MyChart is used to connect with patients for Virtual Visits (Telemedicine).  Patients are able to view lab/test results, encounter notes, upcoming appointments, etc.  Non-urgent messages can be sent to your provider as well.   To learn more about what you can do with MyChart, go to [email or web address].    Your next appointment:   1 month(s) after your ablation  The format for your next appointment:   In Person  Provider:   AFib clinic   Thank you for choosing [facility]!!   Dory Horn, RN [phone]    Other Instructions   CT INSTRUCTIONS Your cardiac CT will be scheduled at one of the below locations:   [facility] [address] [place], [place] [address] [phone]  Please arrive at the [facility] main entrance (entrance A) of [facility] 30 minutes prior to test start time. Proceed to the [facility] Radiology Department (first floor) to check-in and test prep.   Please follow these instructions carefully (unless otherwise directed):  Hold all erectile dysfunction medications at least 3 days (72 hrs) prior to test.  On the Night Before the Test: Be sure to Drink plenty of water. Do not consume any  caffeinated/decaffeinated beverages or chocolate 12 hours prior to your test. Do not take any antihistamines 12 hours prior to your test.  On the Day of the Test: Drink plenty of water until 1 hour prior to the test. Do not eat any food 4 hours prior to the test. You may take your regular medications prior to the test.  Take metoprolol (Lopressor) two hours prior to test. HOLD  Furosemide/Hydrochlorothiazide morning of the test   After the Test: Drink plenty of water. After receiving IV contrast, you may experience a mild flushed feeling. This is normal. On occasion, you may experience a mild rash up to 24 hours after the test. This is not dangerous. If this occurs, you can take Benadryl 25 mg and increase your fluid intake. If you experience trouble breathing, this can be serious. If it is severe call 911 IMMEDIATELY. If it is mild, please call our office. If you take any of these medications: Glipizide/Metformin, Avandament, Glucavance, please do not take 48 hours after completing test unless otherwise instructed.   Once we have confirmed authorization from your insurance company, we will call you to set up a date and time for your test. Based on how quickly your insurance processes prior authorizations requests, please allow up to 4 weeks to be contacted for scheduling your Cardiac CT appointment. Be advised that routine Cardiac CT appointments could be scheduled as many as 8 weeks after your provider has ordered it.  For non-scheduling related questions, please contact the cardiac imaging nurse navigator should you have any questions/concerns: Rockwell Alexandria, Cardiac Imaging Nurse Navigator Larey Brick, Cardiac Imaging Nurse Navigator [facility] Direct Office Dial: [phone]   For scheduling needs, including cancellations and rescheduling, please call [place], [phone].     Electrophysiology/Ablation Procedure Instructions   You are scheduled for a(n)  ablation on 03/04/2021 with Dr. Loman Brooklyn.   1.   Pre procedure testing-             A.  LAB WORK ---  between 8/15 - 8/26 at the [facility] for your pre procedure blood work.  You do NOT need to be fasting.   On the day of your procedure 03/04/2021 you will go to [facility] [phone]) at 9:30 am.  Bonita Quin will go to the main entrance A [employer] ) and enter where the [employer] are.  Your driver will drop you off and you will head down the hallway to ADMITTING.  You may have one support person come in to the hospital with you.  They will be asked to wait in the waiting room. It is OK to have someone drop you off and come back when you are ready to be discharged.   3.   Do not eat or drink after midnight prior to your procedure.   4.   On the morning of your procedure do NOT take any medication. Do not miss any doses of your blood thinner prior to the morning of your procedure or your procedure will need to be rescheduled.   5.  Plan for an overnight stay but you may be discharged after your procedure, if you use your phone frequently bring your phone charger. If you are discharged after your procedure you will need someone to drive you home and be with you for 24 hours after your procedure.   6. You will follow up with the AFIB clinic 4 weeks after your procedure.  You will follow up with Dr. Elberta Fortis  3 months after your procedure.  These appointments will be made for you.   7. FYI: For your safety, and to allow [place] to monitor your vital signs accurately during the surgery/procedure we request that if you have artificial nails, gel coating, SNS etc. Please have those removed prior to your surgery/procedure. Not having the nail coverings /polish removed may result in cancellation or delay of your surgery/procedure.  * If you have ANY questions please call the office [phone] and ask for Kiaraliz Rafuse RN or send me a MyChart message   * Occasionally, EP Studies and ablations can become lengthy.  Please make your family aware of this before your procedure starts.  Average time ranges from 2-8 hours for EP studies/ablations.  Your physician will call your family after the procedure with the results.

## 2021-01-22 ENCOUNTER — Telehealth (HOSPITAL_COMMUNITY): Payer: Self-pay | Admitting: *Deleted

## 2021-01-22 MED ORDER — METOPROLOL TARTRATE 25 MG PO TABS: 25.0000 mg | ORAL_TABLET | Freq: Two times a day (BID) | ORAL | 1 refills | Status: DC

## 2021-01-22 NOTE — Telephone Encounter (Signed)
Pt having intermittent HRs increasing that seem to correlate with food intake. His heart rate last PM after dinner was from 120-170. He decided to take his metoprolol dose early after 45 minutes no improvement of HR and took an extra tablet of metoprolol this helped correct his HR to the 80s.  Discussed with Jorja Loa PA will try taking an extra 25mg  of metoprolol daily as needed for elevated HRS. If having to take frequently will look to increase metoprolol to 37.5mg  BID as BP will allow. Educated on ER precautions as they were very frustrated he presented to ER for afib and sat in the waiting room for 6 hours without treatment this past weekend.

## 2021-02-12 LAB — BASIC METABOLIC PANEL
BUN/Creatinine Ratio: 9 — ABNORMAL LOW (ref 10–24)
BUN: 9 mg/dL (ref 8–27)
CO2: 26 mmol/L (ref 20–29)
Calcium: 9.7 mg/dL (ref 8.6–10.2)
Chloride: 94 mmol/L — ABNORMAL LOW (ref 96–106)
Creatinine, Ser: 0.98 mg/dL (ref 0.76–1.27)
Glucose: 89 mg/dL (ref 65–99)
Potassium: 4.1 mmol/L (ref 3.5–5.2)
Sodium: 136 mmol/L (ref 134–144)
eGFR: 83 mL/min/{1.73_m2} (ref >59)

## 2021-02-12 LAB — CBC
Hematocrit: 42.8 % (ref 37.5–51.0)
Hemoglobin: 14.5 g/dL (ref 13.0–17.7)
MCH: 32.7 pg (ref 26.6–33.0)
MCHC: 33.9 g/dL (ref 31.5–35.7)
MCV: 96 fL (ref 79–97)
Platelets: 200 10*3/uL (ref 150–450)
RBC: 4.44 x10E6/uL (ref 4.14–5.80)
RDW: 12.1 % (ref 11.6–15.4)
WBC: 4.7 10*3/uL (ref 3.4–10.8)

## 2021-02-16 ENCOUNTER — Institutional Professional Consult (permissible substitution): Payer: Medicare Other | Admitting: Cardiology

## 2021-02-23 ENCOUNTER — Telehealth: Payer: Self-pay | Admitting: Cardiology

## 2021-02-23 NOTE — Telephone Encounter (Signed)
4.   On the morning of your procedure do NOT take any medication. Do not miss any doses of your blood thinner prior to the morning of your procedure or your procedure will need to be rescheduled.  Called and spoke with pt regarding the above instructions.  He typically takes his medication at 11 AM and 11 PM.  Advised pt he should take his pm dose and not his AM dose.  He states understanding.   Of note, pt's wife states she will be with him the day of his procedure, she is blind and is asking if she can sit with him after his procedure while he is in recovery.  Advised that will be determined by hospital policy and she will need to ask the day of the procedure.  She states understanding.  They had no further questions at this time.

## 2021-02-23 NOTE — Telephone Encounter (Signed)
Pt c/o medication issue:  1. Name of Medication:  apixaban (ELIQUIS) 5 MG TABS tablet metoprolol tartrate (LOPRESSOR) 25 MG tablet  2. How are you currently taking this medication (dosage and times per day)? As directed  3. Are you having a reaction (difficulty breathing--STAT)?   4. What is your medication issue? Patient wanted clinical advice as to how to take his medications specifically on the day of his ablation 03/04/21. There are conflicting instructions.

## 2021-02-24 ENCOUNTER — Telehealth (HOSPITAL_COMMUNITY): Payer: Self-pay | Admitting: *Deleted

## 2021-02-24 NOTE — Telephone Encounter (Signed)
Attempted to call patient regarding upcoming cardiac CT appointment. °Left message on voicemail with name and callback number ° °Kortez Murtagh RN Navigator Cardiac Imaging °[facility] °[phone] Office °[phone] Cell ° °

## 2021-02-24 NOTE — Telephone Encounter (Signed)
Patient returning call regarding upcoming cardiac imaging study; pt verbalizes understanding of appt date/time, parking situation and where to check in, pre-test NPO status and verified current allergies; name and call back number provided for further questions should they arise  Larey Brick RN Navigator Cardiac Imaging [facility] [phone] office [phone] cell  Patient to take his AM dose of metoprolol two hours prior to cardiac CT.

## 2021-02-25 ENCOUNTER — Other Ambulatory Visit: Payer: Self-pay

## 2021-02-25 ENCOUNTER — Ambulatory Visit (HOSPITAL_COMMUNITY)
Admission: RE | Admit: 2021-02-25 | Discharge: 2021-02-25 | Disposition: A | Discharge status: Home / Self Care | Payer: Medicare Other | Source: Ambulatory Visit | Attending: Cardiology | Admitting: Cardiology

## 2021-02-25 DIAGNOSIS — I4819 Other persistent atrial fibrillation: Secondary | ICD-10-CM | POA: Diagnosis present

## 2021-02-25 MED ORDER — IOHEXOL 350 MG/ML SOLN
80.0000 mL | Freq: Once | INTRAVENOUS | Status: AC | PRN
  Administered 2021-02-25: 80 mL via INTRAVENOUS

## 2021-03-03 NOTE — Pre-Procedure Instructions (Signed)
Instructed patient on the following items: °Arrival time 0930 °Nothing to eat or drink after midnight °No meds AM of procedure °Responsible person to drive you home and stay with you for 24 hrs ° °Have you missed any doses of anti-coagulant Eliquis- hasn't missed any doses °   °

## 2021-03-03 NOTE — Telephone Encounter (Signed)
Spoke to pt and wife. All questions answered. He is aware tomorrow morning is the only time he should hold his blood thinner. They appreciate my follow up

## 2021-03-04 ENCOUNTER — Ambulatory Visit (HOSPITAL_COMMUNITY): Payer: Medicare Other | Admitting: Anesthesiology

## 2021-03-04 ENCOUNTER — Ambulatory Visit (HOSPITAL_COMMUNITY)
Admission: RE | Admit: 2021-03-04 | Discharge: 2021-03-04 | Disposition: A | Discharge status: Home / Self Care | Payer: Medicare Other | Attending: Cardiology | Admitting: Cardiology

## 2021-03-04 ENCOUNTER — Encounter (HOSPITAL_COMMUNITY)
Admission: RE | Disposition: A | Discharge status: Home / Self Care | Payer: Self-pay | Source: Home / Self Care | Attending: Cardiology

## 2021-03-04 ENCOUNTER — Other Ambulatory Visit: Payer: Self-pay

## 2021-03-04 DIAGNOSIS — I4819 Other persistent atrial fibrillation: Secondary | ICD-10-CM | POA: Insufficient documentation

## 2021-03-04 HISTORY — PX: ATRIAL FIBRILLATION ABLATION: EP1191

## 2021-03-04 LAB — POCT ACTIVATED CLOTTING TIME
Activated Clotting Time: 341 seconds
Activated Clotting Time: 341 seconds

## 2021-03-04 SURGERY — ATRIAL FIBRILLATION ABLATION
Anesthesia: General

## 2021-03-04 MED ORDER — HEPARIN SODIUM (PORCINE) 1000 UNIT/ML IJ SOLN
INTRAMUSCULAR | Status: DC | PRN
  Administered 2021-03-04: 1000 [IU] via INTRAVENOUS

## 2021-03-04 MED ORDER — LIDOCAINE 2% (20 MG/ML) 5 ML SYRINGE
INTRAMUSCULAR | Status: DC | PRN
  Administered 2021-03-04: 20 mg via INTRAVENOUS

## 2021-03-04 MED ORDER — SODIUM CHLORIDE 0.9% FLUSH: 3.0000 mL | Freq: Two times a day (BID) | INTRAVENOUS | Status: DC

## 2021-03-04 MED ORDER — SUGAMMADEX SODIUM 200 MG/2ML IV SOLN
INTRAVENOUS | Status: DC | PRN
  Administered 2021-03-04: 200 mg via INTRAVENOUS

## 2021-03-04 MED ORDER — HEPARIN SODIUM (PORCINE) 1000 UNIT/ML IJ SOLN
INTRAMUSCULAR | Status: DC | PRN
  Administered 2021-03-04 (×2): 2000 [IU] via INTRAVENOUS
  Administered 2021-03-04: 14000 [IU] via INTRAVENOUS

## 2021-03-04 MED ORDER — HEPARIN (PORCINE) IN NACL 1000-0.9 UT/500ML-% IV SOLN
INTRAVENOUS | Status: DC | PRN
  Administered 2021-03-04 (×2): 500 mL

## 2021-03-04 MED ORDER — SODIUM CHLORIDE 0.9% FLUSH: 3.0000 mL | INTRAVENOUS | Status: DC | PRN

## 2021-03-04 MED ORDER — FENTANYL CITRATE (PF) 250 MCG/5ML IJ SOLN
INTRAMUSCULAR | Status: DC | PRN
  Administered 2021-03-04: 50 ug via INTRAVENOUS

## 2021-03-04 MED ORDER — SODIUM CHLORIDE 0.9 % IV SOLN: 250.0000 mL | INTRAVENOUS | Status: DC | PRN

## 2021-03-04 MED ORDER — DEXAMETHASONE SODIUM PHOSPHATE 10 MG/ML IJ SOLN
INTRAMUSCULAR | Status: DC | PRN
  Administered 2021-03-04: 10 mg via INTRAVENOUS

## 2021-03-04 MED ORDER — HEPARIN SODIUM (PORCINE) 1000 UNIT/ML IJ SOLN
INTRAMUSCULAR | Status: AC
  Filled 2021-03-04: qty 1

## 2021-03-04 MED ORDER — ONDANSETRON HCL 4 MG/2ML IJ SOLN
INTRAMUSCULAR | Status: DC | PRN
  Administered 2021-03-04: 4 mg via INTRAVENOUS

## 2021-03-04 MED ORDER — ROCURONIUM BROMIDE 10 MG/ML (PF) SYRINGE
PREFILLED_SYRINGE | INTRAVENOUS | Status: DC | PRN
  Administered 2021-03-04: 100 mg via INTRAVENOUS

## 2021-03-04 MED ORDER — LACTATED RINGERS IV SOLN: INTRAVENOUS | Status: DC | PRN

## 2021-03-04 MED ORDER — ALBUMIN HUMAN 5 % IV SOLN: INTRAVENOUS | Status: DC | PRN

## 2021-03-04 MED ORDER — PROPOFOL 10 MG/ML IV BOLUS
INTRAVENOUS | Status: DC | PRN
  Administered 2021-03-04: 100 mg via INTRAVENOUS

## 2021-03-04 MED ORDER — ACETAMINOPHEN 325 MG PO TABS
650.0000 mg | ORAL_TABLET | ORAL | Status: DC | PRN
  Filled 2021-03-04: qty 2

## 2021-03-04 MED ORDER — HEPARIN (PORCINE) IN NACL 1000-0.9 UT/500ML-% IV SOLN
INTRAVENOUS | Status: DC | PRN
Start: 2021-03-04 — End: 2021-03-04
  Administered 2021-03-04: 500 mL

## 2021-03-04 MED ORDER — ONDANSETRON HCL 4 MG/2ML IJ SOLN: 4.0000 mg | Freq: Four times a day (QID) | INTRAMUSCULAR | Status: DC | PRN

## 2021-03-04 MED ORDER — PHENYLEPHRINE 40 MCG/ML (10ML) SYRINGE FOR IV PUSH (FOR BLOOD PRESSURE SUPPORT)
PREFILLED_SYRINGE | INTRAVENOUS | Status: DC | PRN
  Administered 2021-03-04: 160 ug via INTRAVENOUS
  Administered 2021-03-04 (×3): 120 ug via INTRAVENOUS
  Administered 2021-03-04: 160 ug via INTRAVENOUS

## 2021-03-04 MED ORDER — SODIUM CHLORIDE 0.9 % IV SOLN: INTRAVENOUS | Status: DC

## 2021-03-04 MED ORDER — PROTAMINE SULFATE 10 MG/ML IV SOLN
INTRAVENOUS | Status: DC | PRN
  Administered 2021-03-04: 40 mg via INTRAVENOUS

## 2021-03-04 MED ORDER — DOBUTAMINE INFUSION FOR EP/ECHO/NUC (1000 MCG/ML)
INTRAVENOUS | Status: AC
  Filled 2021-03-04: qty 250

## 2021-03-04 MED ORDER — PHENYLEPHRINE HCL-NACL 20-0.9 MG/250ML-% IV SOLN
INTRAVENOUS | Status: DC | PRN
  Administered 2021-03-04: 25 ug/min via INTRAVENOUS

## 2021-03-04 MED ORDER — MIDAZOLAM HCL 2 MG/2ML IJ SOLN
INTRAMUSCULAR | Status: DC | PRN
  Administered 2021-03-04: 1 mg via INTRAVENOUS

## 2021-03-04 SURGICAL SUPPLY — 21 items
BLANKET WARM UNDERBOD FULL ACC (MISCELLANEOUS) ×2 IMPLANT
CATH 8FR REPROCESSED SOUNDSTAR (CATHETERS) ×2 IMPLANT
CATH OCTARAY 1.5 F (CATHETERS) ×2 IMPLANT
CATH OCTARAY 2.0 F 3-3-3-3-3 (CATHETERS) ×2 IMPLANT
CATH S CIRCA THERM PROBE 10F (CATHETERS) ×2 IMPLANT
CATH SMTCH THERMOCOOL SF DF (CATHETERS) ×2 IMPLANT
CATH WEB BI DIR CSDF CRV REPRO (CATHETERS) ×2 IMPLANT
CLOSURE PERCLOSE PROSTYLE (VASCULAR PRODUCTS) ×8 IMPLANT
COVER SWIFTLINK CONNECTOR (BAG) ×2 IMPLANT
NEEDLE BAYLIS TRANSSEPTAL 71CM (NEEDLE) ×2 IMPLANT
PACK EP LATEX FREE (CUSTOM PROCEDURE TRAY) ×2
PACK EP LF (CUSTOM PROCEDURE TRAY) ×1 IMPLANT
PAD PRO RADIOLUCENT 2001M-C (PAD) ×2 IMPLANT
PATCH CARTO3 (PAD) ×2 IMPLANT
SHEATH BAYLIS TRANSSEPTAL 98CM (NEEDLE) ×2 IMPLANT
SHEATH CARTO VIZIGO SM CVD (SHEATH) ×2 IMPLANT
SHEATH PINNACLE 7F 10CM (SHEATH) ×2 IMPLANT
SHEATH PINNACLE 8F 10CM (SHEATH) ×4 IMPLANT
SHEATH PINNACLE 9F 10CM (SHEATH) ×2 IMPLANT
SHEATH SWARTZ TS SL2 63CM 8.5F (SHEATH) ×2 IMPLANT
TUBING SMART ABLATE COOLFLOW (TUBING) ×2 IMPLANT

## 2021-03-04 NOTE — Discharge Instructions (Signed)
Post procedure care instructions No driving for 4 days. No lifting over 5 lbs for 1 week. No vigorous or sexual activity for 1 week. You may return to work/your usual activities on 03/12/21. Keep procedure site clean & dry. If you notice increased pain, swelling, bleeding or pus, call/return!  You may shower after 24 hours, but no soaking in baths/hot tubs/pools for 1 week.     You have an appointment set up with the Atrial Fibrillation Clinic.  Multiple studies have shown that being followed by a dedicated atrial fibrillation clinic in addition to the standard care you receive from your other physicians improves health. We believe that enrollment in the atrial fibrillation clinic will allow [place] to better care for you.   The phone number to the Atrial Fibrillation Clinic is [phone]. The clinic is staffed Monday through Friday from 8:30am to 5pm.  Parking Directions: The clinic is located in the Heart and Vascular Building connected to [facility]. 1)From [address] turn on to [address] and go to the 3rd entrance  (Heart and Vascular entrance) on the right. 2)Look to the right for Heart &Vascular Parking Garage. 3)A code for the entrance is required, for Oct is 3333.   4)Take the elevators to the 1st floor. Registration is in the room with the glass walls at the end of the hallway.  If you have any trouble parking or locating the clinic, please don't hesitate to call [phone].

## 2021-03-04 NOTE — Anesthesia Preprocedure Evaluation (Addendum)
Anesthesia Evaluation  Patient identified by MRN, date of birth, ID band Patient awake    Reviewed: Allergy & Precautions, NPO status , Patient's Chart, lab work & pertinent test results  Airway Mallampati: II  TM Distance: >3 FB Neck ROM: Full    Dental no notable dental hx.    Pulmonary neg pulmonary ROS,    Pulmonary exam normal breath sounds clear to auscultation       Cardiovascular Normal cardiovascular exam+ dysrhythmias (eliquis) Atrial Fibrillation and Supra Ventricular Tachycardia  Rhythm:Regular Rate:Normal     Neuro/Psych negative neurological ROS  negative psych ROS   GI/Hepatic negative GI ROS, Neg liver ROS,   Endo/Other  negative endocrine ROS  Renal/GU negative Renal ROS  negative genitourinary   Musculoskeletal negative musculoskeletal ROS (+)   Abdominal   Peds  Hematology negative hematology ROS (+)   Anesthesia Other Findings   Reproductive/Obstetrics negative OB ROS                            Anesthesia Physical Anesthesia Plan  ASA: 2  Anesthesia Plan: General   Post-op Pain Management:    Induction: Intravenous  PONV Risk Score and Plan: 2 and Ondansetron, Dexamethasone, Midazolam and Treatment may vary due to age or medical condition  Airway Management Planned: Oral ETT  Additional Equipment: None  Intra-op Plan:   Post-operative Plan: Extubation in OR  Informed Consent: I have reviewed the patients History and Physical, chart, labs and discussed the procedure including the risks, benefits and alternatives for the proposed anesthesia with the patient or authorized representative who has indicated his/her understanding and acceptance.     Dental advisory given  Plan Discussed with: CRNA  Anesthesia Plan Comments:         Anesthesia Quick Evaluation

## 2021-03-04 NOTE — Transfer of Care (Signed)
Immediate Anesthesia Transfer of Care Note  Patient: Darius Williamson  Procedure(s) Performed: ATRIAL FIBRILLATION ABLATION  Patient Location: Cath Lab  Anesthesia Type:General  Level of Consciousness: awake, alert  and oriented  Airway & Oxygen Therapy: Patient Spontanous Breathing  Post-op Assessment: Report given to RN, Post -op Vital signs reviewed and stable and Patient moving all extremities  Post vital signs: Reviewed and stable  Last Vitals:  Vitals Value Taken Time  BP 80/52 03/04/21 1427  Temp    Pulse 71 03/04/21 1428  Resp 12 03/04/21 1428  SpO2 98 % 03/04/21 1428  Vitals shown include unvalidated device data.  Last Pain:  Vitals:   03/04/21 0913  TempSrc:   PainSc: 0-No pain         Complications: There were no known notable events for this encounter.

## 2021-03-04 NOTE — Anesthesia Procedure Notes (Signed)
Procedure Name: Intubation Date/Time: 03/04/2021 12:15 PM Performed by: Leonor Liv, CRNA Pre-anesthesia Checklist: Patient identified, Emergency Drugs available, Suction available and Patient being monitored Patient Re-evaluated:Patient Re-evaluated prior to induction Oxygen Delivery Method: Circle System Utilized Preoxygenation: Pre-oxygenation with 100% oxygen Induction Type: IV induction Ventilation: Mask ventilation without difficulty Laryngoscope Size: Glidescope and 4 Grade View: Grade II Tube type: Oral Tube size: 7.5 mm Number of attempts: 1 Airway Equipment and Method: Stylet and Oral airway Placement Confirmation: ETT inserted through vocal cords under direct vision, positive ETCO2 and breath sounds checked- equal and bilateral Secured at: 23 cm Tube secured with: Tape Dental Injury: Teeth and Oropharynx as per pre-operative assessment  Difficulty Due To: Difficult Airway- due to anterior larynx Comments: DLx1 MAC 4 grade III view, glide L4 x1 grade II view. Anterior larynx

## 2021-03-04 NOTE — H&P (Signed)
Electrophysiology Office Note   Date:  03/04/2021   ID:  Darius Williamson, DOB 01-22-1951, MRN [id number]  PCP:  Darius Williamson, Darius Russell, MD  Cardiologist:   Primary Electrophysiologist:  Darius Antilla Jorja Loa, MD    Chief Complaint: AF   History of Present Illness: Darius Williamson is a 70 y.o. male who is being seen today for the evaluation of AF at the request of No ref. provider found. Presenting today for electrophysiology evaluation.  He has a history significant for atrial fibrillation.  He presented initially in 2017 to the emergency room with palpitations and weakness.  Over the years he has had infrequent episodes, but more recently, episodes have become persistent, lasting for several weeks.  There are no specific triggers that he can identify.  He denies significant alcohol abuse or snoring.  He has symptoms of palpitations and fatigue when he is in atrial fibrillation.  Today, denies symptoms of palpitations, chest pain, shortness of breath, orthopnea, PND, lower extremity edema, claudication, dizziness, presyncope, syncope, bleeding, or neurologic sequela. The patient is tolerating medications without difficulties. Ablation today.    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   Social History:  The patient  reports that he has never smoked. He has never used smokeless tobacco. He reports that he does not currently use alcohol after a past usage of about 2.0 standard drinks per week. He reports that he does  not use drugs.   Family History:  The patient's family history includes Stroke in his mother.   ROS:  Please see the history of present illness.   Otherwise, review of systems is positive for none.   All other systems are reviewed and negative.   PHYSICAL EXAM: VS:  BP 107/74   Pulse 87   Temp 98.6 F (37 C) (Oral)   Resp 20   Ht 5\' 7"  (1.702 m)   Wt 55.8 kg   SpO2 99%   BMI 19.26 kg/m  , BMI Body mass index is 19.26 kg/m. GEN: Well nourished, well developed, in no acute distress  HEENT: normal  Neck: no JVD, carotid bruits, or masses Cardiac: irregular; no murmurs, rubs, or gallops,no edema  Respiratory:  clear to auscultation bilaterally, normal work of breathing GI: soft, nontender, nondistended, + BS MS: no deformity or atrophy  Skin: warm and dry Neuro:  Strength and sensation are intact Psych: euthymic mood, full affect  Recent Labs: 02/11/2021: BUN 9; Creatinine, Ser 0.98; Hemoglobin 14.5; Platelets 200; Potassium 4.1; Sodium 136    Lipid Panel  No results found for: CHOL, TRIG, HDL, CHOLHDL, VLDL, LDLCALC, LDLDIRECT   Wt Readings from Last 3 Encounters:  03/04/21 55.8 kg  01/20/21 59.4 kg  01/06/21 59.1 kg      Other studies Reviewed: Additional studies/ records that were reviewed today include: TTE 06/30/16  Review of the above records today demonstrates:  - Left ventricle: The cavity size was normal. Systolic function was    normal. The estimated ejection  fraction was in the range of 55%    to 60%. Wall motion was normal; there were no regional wall    motion abnormalities. Left ventricular diastolic function    parameters were normal.  -Left atrium:  The atrium was normal in size.  ASSESSMENT AND PLAN:  1.  Persistent atrial fibrillation: Darius Williamson has presented today for surgery, with the diagnosis of atrial fibrillation.  The various methods of treatment have been discussed with the patient and family. After consideration of risks, benefits and  other options for treatment, the patient has consented to  Procedure(s): Catheter ablation as a surgical intervention .  Risks include but not limited to complete heart block, stroke, esophageal damage, nerve damage, bleeding, vascular damage, tamponade, perforation, MI, and death. The patient's history has been reviewed, patient examined, no change in status, stable for surgery.  I have reviewed the patient's chart and labs.  Questions were answered to the patient's satisfaction.    Darius Williamson Darius Fortis, MD 03/04/2021 11:14 AM

## 2021-03-05 ENCOUNTER — Encounter (HOSPITAL_COMMUNITY): Payer: Self-pay | Admitting: Cardiology

## 2021-03-05 MED FILL — Dobutamine in Dextrose 5% Inj 1 MG/ML: INTRAVENOUS | Qty: 250 | Status: AC

## 2021-03-09 NOTE — Anesthesia Postprocedure Evaluation (Signed)
Anesthesia Post Note  Patient: Darius Williamson  Procedure(s) Performed: ATRIAL FIBRILLATION ABLATION     Patient location during evaluation: PACU Anesthesia Type: General Level of consciousness: sedated and patient cooperative Pain management: pain level controlled Vital Signs Assessment: post-procedure vital signs reviewed and stable Respiratory status: spontaneous breathing Cardiovascular status: stable Anesthetic complications: no   There were no known notable events for this encounter.  Last Vitals:  Vitals:   03/04/21 1733 03/04/21 1748  BP: 100/61 (!) 105/55  Pulse: 81 82  Resp: 11 11  Temp:    SpO2: 100% 100%    Last Pain:  Vitals:   03/05/21 1123  TempSrc:   PainSc: 0-No pain                 [name]

## 2021-04-01 ENCOUNTER — Other Ambulatory Visit: Payer: Self-pay

## 2021-04-01 ENCOUNTER — Encounter (HOSPITAL_COMMUNITY): Payer: Self-pay | Admitting: Physician Assistant

## 2021-04-01 ENCOUNTER — Ambulatory Visit (HOSPITAL_COMMUNITY)
Admission: RE | Admit: 2021-04-01 | Discharge: 2021-04-01 | Disposition: A | Discharge status: Home / Self Care | Payer: Medicare Other | Source: Ambulatory Visit | Attending: Physician Assistant | Admitting: Physician Assistant

## 2021-04-01 VITALS — BP 104/64 | HR 63 | Ht 67.0 in | Wt 127.8 lb

## 2021-04-01 DIAGNOSIS — I4819 Other persistent atrial fibrillation: Secondary | ICD-10-CM | POA: Insufficient documentation

## 2021-04-01 DIAGNOSIS — Z7901 Long term (current) use of anticoagulants: Secondary | ICD-10-CM | POA: Insufficient documentation

## 2021-04-01 MED ORDER — APIXABAN 5 MG PO TABS: 5.0000 mg | ORAL_TABLET | Freq: Two times a day (BID) | ORAL | 11 refills | Status: DC

## 2021-04-01 NOTE — Progress Notes (Signed)
Primary Care Physician: Randel Pigg, Dorma Russell, MD Primary Cardiologist: none Primary Electrophysiologist: Dr Elberta Fortis Referring Physician: [facility]   Darius Williamson is a 70 y.o. male with a history of atrial fibrillation who presents for consultation in the [facility].  The patient was initially diagnosed with atrial fibrillation in 2017 after presenting to ED with symptoms of palpitations and weakness. He has a CHADS2VASC score of 1. Over the years, he has had infrequent afib which lasted < 1 day. But lately his afib has been more persistent, the longest lasting several weeks. He reports today that this present episode started 12/17/20. There are no specific triggers that he can identify. He denies significant snoring or alcohol use. He does have symptoms of palpitations and fatigue while in afib.   On follow up today, patient is s/p afib ablation with Dr Elberta Fortis on 03/04/21. Patient reports that he has done well since the procedure. His energy level is back to baseline. He denies CP, swallowing pain, or groin issues.   Today, he denies symptoms of palpitations, chest pain, shortness of breath, orthopnea, PND, lower extremity edema, dizziness, presyncope, syncope, snoring, daytime somnolence, bleeding, or neurologic sequela. The patient is tolerating medications without difficulties and is otherwise without complaint today.    Atrial Fibrillation Risk Factors:  he does not have symptoms or diagnosis of sleep apnea. he does not have a history of rheumatic fever. he does not have a history of alcohol use. The patient does not have a history of early familial atrial fibrillation or other arrhythmias.  he has a BMI of Body mass index is 20.02 kg/m.Marland Kitchen Filed Weights   04/01/21 0959  Weight: 58 kg     Family History  Problem Relation Age of Onset   Stroke Mother      Atrial Fibrillation Management history:  Previous antiarrhythmic drugs: none Previous  cardioversions: none Previous ablations: none CHADS2VASC score: 1 Anticoagulation history: Eliquis   Past Medical History:  Diagnosis Date   SVT (supraventricular tachycardia) (HCC) 2012   Past Surgical History:  Procedure Laterality Date   ATRIAL FIBRILLATION ABLATION N/A 03/04/2021   Procedure: ATRIAL FIBRILLATION ABLATION;  Surgeon: Regan Lemming, MD;  Location: [facility];  Service: Cardiovascular;  Laterality: N/A;   EYE SURGERY     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     ascorbic acid (VITAMIN C) 1000 MG tablet Take 1,000 mg by mouth daily.     Calcium 250 MG CAPS Take 250 mg by mouth daily.     Cholecalciferol (VITAMIN D) 50 MCG (2000 UT) tablet Take 4,000 Units by mouth daily.     fexofenadine (ALLEGRA) 180 MG tablet Take 180 mg by mouth daily as needed for allergies.     Magnesium 250 MG TABS Take 250 mg by mouth daily.     metoprolol tartrate (LOPRESSOR) 25 MG tablet Take 1 tablet (25 mg total) by mouth 2 (two) times daily. May take extra tablet daily as needed for HR over 100 90 tablet 1   Misc Natural Product Nasal (NASAL CLEANSE RINSE MIX NA) Place 1 Dose into the nose daily as needed (congestion).     Multiple Vitamin (MULTI-VITAMIN) tablet Take 1 tablet by mouth daily.  sildenafil (REVATIO) 20 MG tablet Take 20 mg by mouth daily as needed (Erectile dysfunction).     apixaban (ELIQUIS) 5 MG TABS tablet Take 1 tablet (5 mg total) by mouth 2 (two) times daily. 60 tablet 11   No current facility-administered medications for this encounter.    No Known Allergies  Social History   Socioeconomic History   Marital status: Married    Spouse name: Not on file   Number of children: Not on file   Years of education: Not on file   Highest education  level: Not on file  Occupational History   Not on file  Tobacco Use   Smoking status: Never   Smokeless tobacco: Never  Substance and Sexual Activity   Alcohol use: Not Currently    Alcohol/week: 2.0 standard drinks    Types: 2 Cans of beer per week    Comment: 2 beers a week   Drug use: No   Sexual activity: Not on file  Other Topics Concern   Not on file  Social History Narrative   Not on file   Social Determinants of Health   Financial Resource Strain: Not on file  Food Insecurity: Not on file  Transportation Needs: Not on file  Physical Activity: Not on file  Stress: Not on file  Social Connections: Not on file  Intimate Partner Violence: Not on file     ROS- All systems are reviewed and negative except as per the HPI above.  Physical Exam: Vitals:   04/01/21 0959  BP: 104/64  Pulse: 63  Weight: 58 kg  Height: 5\' 7"  (1.702 m)    GEN- The patient is a well appearing male, alert and oriented x 3 today.   HEENT-head normocephalic, atraumatic, sclera clear, conjunctiva pink, hearing intact, trachea midline. Lungs- Clear to ausculation bilaterally, normal work of breathing Heart- Regular rate and rhythm, no murmurs, rubs or gallops  GI- soft, NT, ND, + BS Extremities- no clubbing, cyanosis, or edema MS- no significant deformity or atrophy Skin- no rash or lesion Psych- euthymic mood, full affect Neuro- strength and sensation are intact   Wt Readings from Last 3 Encounters:  04/01/21 58 kg  03/04/21 55.8 kg  01/20/21 59.4 kg    EKG today demonstrates  SR Vent. rate 63 BPM PR interval 206 ms QRS duration 106 ms QT/QTcB 412/421 ms  Echo 06/30/16 demonstrated  - Left ventricle: The cavity size was normal. Systolic function was    normal. The estimated ejection fraction was in the range of 55%    to 60%. Wall motion was normal; there were no regional wall    motion abnormalities. Left ventricular diastolic function    parameters were normal.   Epic  records are reviewed at length today  CHA2DS2-VASc Score = 1  The patient's score is based upon: CHF History: 0 HTN History: 0 Diabetes History: 0 Stroke History: 0 Vascular Disease History: 0 Age Score: 1 Gender Score: 0     ASSESSMENT AND PLAN: 1. Persistent Atrial Fibrillation (ICD10:  I48.19) The patient's CHA2DS2-VASc score is 1, indicating a 0.6% annual risk of stroke.   S/p afib ablation 03/04/21 Patient appears to be maintaining SR. Continue Eliquis 5 mg BID with no missed doses for 3 months post ablation.  Continue Lopressor 25 mg BID    Follow up with Dr 05/04/21 as scheduled.    Elberta Fortis PA-C Afib Clinic [facility] [address] [place], [place] [phone] 04/01/2021 10:50 AM

## 2021-06-12 ENCOUNTER — Other Ambulatory Visit: Payer: Self-pay | Admitting: Cardiology

## 2021-06-12 ENCOUNTER — Encounter: Payer: Self-pay | Admitting: Cardiology

## 2021-06-12 ENCOUNTER — Other Ambulatory Visit: Payer: Self-pay

## 2021-06-12 ENCOUNTER — Ambulatory Visit (INDEPENDENT_AMBULATORY_CARE_PROVIDER_SITE_OTHER): Payer: Medicare Other | Admitting: Cardiology

## 2021-06-12 VITALS — BP 120/64 | HR 60 | Ht 67.0 in | Wt 128.6 lb

## 2021-06-12 DIAGNOSIS — I4819 Other persistent atrial fibrillation: Secondary | ICD-10-CM

## 2021-06-12 MED ORDER — METOPROLOL TARTRATE 25 MG PO TABS: 25.0000 mg | ORAL_TABLET | Freq: Two times a day (BID) | ORAL | 3 refills | Status: DC | PRN

## 2021-06-12 NOTE — Patient Instructions (Signed)
Medication Instructions:  Your physician has recommended you make the following change in your medication:  STOP Eliquis CHANGE the way you take Lopressor (Metoprolol Tartrate) -- take 25 mg twice daily AS NEEDED for palpitations.  *If you need a refill on your cardiac medications before your next appointment, please call your pharmacy*   Lab Work: None ordered   Testing/Procedures: None ordered   Follow-Up: At [facility], you and your health needs are our priority.  As part of our continuing mission to provide you with exceptional heart care, we have created designated Provider Care Teams.  These Care Teams include your primary Cardiologist (physician) and Advanced Practice Providers (APPs -  Physician Assistants and Nurse Practitioners) who all work together to provide you with the care you need, when you need it.  We recommend signing up for the patient portal called "MyChart".  Sign up information is provided on this After Visit Summary.  MyChart is used to connect with patients for Virtual Visits (Telemedicine).  Patients are able to view lab/test results, encounter notes, upcoming appointments, etc.  Non-urgent messages can be sent to your provider as well.   To learn more about what you can do with MyChart, go to [email or web address].    Your next appointment:   3 month(s)  The format for your next appointment:   In Person  Provider:   Loman Brooklyn, MD    Thank you for choosing [facility]!!   Dory Horn, RN [phone]

## 2021-06-12 NOTE — Progress Notes (Signed)
Electrophysiology Office Note   Date:  06/12/2021   ID:  Darius Williamson, DOB [birth year], MRN [id number]  PCP:  Patrecia Pour, Christean Grief, MD  Cardiologist:   Primary Electrophysiologist:  Nilda Keathley Meredith Leeds, MD    Chief Complaint: AF   History of Present Illness: Darius Williamson is a 70 y.o. male who is being seen today for the evaluation of AF at the request of Doreatha Lew, MD. Presenting today for electrophysiology evaluation.  He has a history significant for atrial fibrillation.  He initially presented in 2017 with palpitations and weakness.  He has had infrequent episodes over the years, more recently episodes become persistent lasting for several weeks.  He is now status post ablation 03/04/2021.  Today, denies symptoms of palpitations, chest pain, shortness of breath, orthopnea, PND, lower extremity edema, claudication, dizziness, presyncope, syncope, bleeding, or neurologic sequela. The patient is tolerating medications without difficulties.  Since his ablation he has done well.  He has noted no further episodes of atrial fibrillation.  He is overall happy with his control.  He has much more energy and his shortness of breath is gone.  He would like to get back to exercising.   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     ascorbic acid (VITAMIN C) 1000 MG tablet Take 1,000 mg by mouth daily.     Calcium 250 MG CAPS Take  250 mg by mouth daily.     Cholecalciferol (VITAMIN D) 50 MCG (2000 UT) tablet Take 4,000 Units by mouth daily.     fexofenadine (ALLEGRA) 180 MG tablet Take 180 mg by mouth daily as needed for allergies.     Magnesium 250 MG TABS Take 250 mg by mouth daily.     metoprolol tartrate (LOPRESSOR) 25 MG tablet Take 1 tablet (25 mg total) by mouth 2 (two) times daily. May take extra tablet daily as needed for HR over 100 90 tablet 1   Misc Natural Product Nasal (NASAL CLEANSE RINSE MIX NA) Place 1 Dose into the nose daily as needed (congestion).     Multiple Vitamin (MULTI-VITAMIN) tablet Take 1 tablet by mouth daily.     sildenafil (REVATIO) 20 MG tablet Take 20 mg by mouth daily as needed (Erectile dysfunction).     No current facility-administered medications for this visit.    Allergies:   Patient has no known allergies.   Social History:  The patient  reports that he has never smoked. He has never used smokeless tobacco. He reports that he does not currently use alcohol after a past usage of about 2.0 standard drinks per week. He reports that he does not use drugs.   Family History:  The patient's family history includes Stroke in his mother.   ROS:  Please see the history of present illness.   Otherwise, review of systems is positive for none.   Williamson other systems are reviewed and negative.  PHYSICAL EXAM: VS:  BP 120/64    Pulse 60    Ht 5\' 7"  (1.702 m)    Wt 128 lb 9.6 oz (58.3 kg)    SpO2 99%    BMI 20.14 kg/m  , BMI Body mass index is 20.14 kg/m. GEN: Well nourished, well developed, in no acute distress  HEENT: normal  Neck: no JVD, carotid bruits, or masses Cardiac: RRR; no murmurs, rubs, or gallops,no edema  Respiratory:  clear to auscultation bilaterally, normal work of breathing GI: soft, nontender, nondistended, + BS MS: no deformity or atrophy  Skin: warm and dry Neuro:  Strength and sensation are intact Psych: euthymic mood, full affect  EKG:  EKG is ordered  today. Personal review of the ekg ordered shows sinus rhythm, rate 60, right superior axis  Recent Labs: 02/11/2021: BUN 9; Creatinine, Ser 0.98; Hemoglobin 14.5; Platelets 200; Potassium 4.1; Sodium 136    Lipid Panel  No results found for: CHOL, TRIG, HDL, CHOLHDL, VLDL, LDLCALC, LDLDIRECT   Wt Readings from Last 3 Encounters:  06/12/21 128 lb 9.6 oz (58.3 kg)  04/01/21 127 lb 12.8 oz (58 kg)  03/04/21 123 lb (55.8 kg)      Other studies Reviewed: Additional studies/ records that were reviewed today include: TTE 06/30/16  Review of the above records today demonstrates:  - Left ventricle: The cavity size was normal. Systolic function was    normal. The estimated ejection fraction was in the range of 55%    to 60%. Wall motion was normal; there were no regional wall    motion abnormalities. Left ventricular diastolic function    parameters were normal.  -Left atrium:  The atrium was normal in size.  ASSESSMENT AND PLAN:  1.  Persistent atrial fibrillation: CHA2DS2-VASc of 1.  Currently on Eliquis and metoprolol.  Is status post ablation 03/04/2021.  He is remained in sinus rhythm since his ablation.  He has done quite well and has much more energy and less shortness of breath.  We Darius Williamson stop his Eliquis today.  He Darius Williamson also take his metoprolol and Ativan as needed basis.   Current medicines are reviewed at length with the patient today.   The patient does not have concerns regarding his medicines.  The following changes were made today: Stop Eliquis, take metoprolol as needed  Labs/ tests ordered today include:  Orders Placed This Encounter  Procedures   EKG 12-Lead       Disposition:   FU with Darius Williamson 3 months  Signed, Darius Williamson 05/04/2021, MD  06/12/2021 2:29 PM     [facility] [address] [place] [phone] (office) [phone] (fax)

## 2021-09-21 ENCOUNTER — Ambulatory Visit (INDEPENDENT_AMBULATORY_CARE_PROVIDER_SITE_OTHER): Payer: Medicare Other | Admitting: Cardiology

## 2021-09-21 ENCOUNTER — Encounter: Payer: Self-pay | Admitting: Cardiology

## 2021-09-21 ENCOUNTER — Other Ambulatory Visit: Payer: Self-pay

## 2021-09-21 VITALS — BP 108/72 | HR 64 | Ht 67.0 in | Wt 126.0 lb

## 2021-09-21 DIAGNOSIS — I4819 Other persistent atrial fibrillation: Secondary | ICD-10-CM | POA: Diagnosis not present

## 2021-09-21 NOTE — Progress Notes (Signed)
? ?Electrophysiology Office Note ? ? ?Date:  09/21/2021  ? ?ID:  Darius Williamson, DOB 1951/06/02, MRN [id number] ? ?PCP:  Darius Williamson, Darius Grief, MD  ?Cardiologist:   ?Primary Electrophysiologist:  Darius Magnussen Meredith Leeds, MD   ? ?Chief Complaint: AF ?  ?History of Present Illness: ?Darius Williamson is a 71 y.o. male who is being seen today for the evaluation of AF at the request of Darius Lew, MD. Presenting today for electrophysiology evaluation. ? ?He has a history significant for atrial fibrillation.  He initially presented in 2017 with palpitations and weakness.  He had infrequent episodes of atrial fibrillation.  Unfortunately he became persistent.  He is status post ablation 03/04/2021. ? ?Today, denies symptoms of palpitations, chest pain, shortness of breath, orthopnea, PND, lower extremity edema, claudication, dizziness, presyncope, syncope, bleeding, or neurologic sequela. The patient is tolerating medications without difficulties.  Since being seen he has done well.  He is noted no further episodes of atrial fibrillation.  He was on the elliptical machine and had tachycardia.  When he got off the elliptical, tachycardia continued.  He did a Valsalva maneuver and tachycardia terminated.  This happened 1 other time that day.  He has not had any further episodes.  This was 1 week ago. ? ? ?Past Medical History:  ?Diagnosis Date  ? SVT (supraventricular tachycardia) (Darius Williamson  ? ?Past Surgical History:  ?Procedure Laterality Date  ? ATRIAL FIBRILLATION ABLATION N/A 03/04/2021  ? Procedure: ATRIAL FIBRILLATION ABLATION;  Surgeon: Darius Haw, MD;  Location: [facility];  Service: Cardiovascular;  Laterality: N/A;  ? EYE SURGERY    ? 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 HERNIA REPAIR    ? ? ? ?Current Outpatient Medications  ?Medication Sig Dispense Refill  ? acetaminophen (TYLENOL) 325 MG tablet Take 650 mg by mouth every 6 (six) hours as needed for moderate pain.    ? ascorbic acid  (VITAMIN C) 1000 MG tablet Take 1,000 mg by mouth daily.    ? Cholecalciferol (VITAMIN D) 50 MCG (2000 UT) tablet Take 4,000 Units by mouth daily.    ? fexofenadine (ALLEGRA) 180 MG tablet Take 180 mg by mouth daily as needed for allergies.    ? Magnesium 250 MG TABS Take 250 mg by mouth daily.    ? metoprolol tartrate (LOPRESSOR) 25 MG tablet Take 1 tablet (25 mg total) by mouth 2 (two) times daily as needed (for palpitations). 180 tablet 2  ? Misc Natural Product Nasal (NASAL CLEANSE RINSE MIX NA) Place 1 Dose into the nose daily as needed (congestion).    ? Multiple Vitamin (MULTI-VITAMIN) tablet Take 1 tablet by mouth daily.    ? sildenafil (REVATIO) 20 MG tablet Take 20 mg by mouth daily as needed (Erectile dysfunction).    ? ?No current facility-administered medications for this visit.  ? ? ?Allergies:   Patient has no known allergies.  ? ?Social History:  The patient  reports that he has never smoked. He has never used smokeless tobacco. He reports that he does not currently use alcohol after a past usage of about 2.0 standard drinks per week. He reports that he does not use drugs.  ? ?Family History:  The patient's family history includes Stroke in his mother.  ? ?ROS:  Please see the history of present illness.   Otherwise, review of systems is positive for none.   All other systems are reviewed and negative.  ? ?PHYSICAL EXAM: ?VS:  BP 108/72  Pulse 64   Ht 5\' 7"  (1.702 m)   Wt 126 lb (57.2 kg)   SpO2 99%   BMI 19.73 kg/m?  , BMI Body mass index is 19.73 kg/m?. ?GEN: Well nourished, well developed, in no acute distress  ?HEENT: normal  ?Neck: no JVD, carotid bruits, or masses ?Cardiac: RRR; no murmurs, rubs, or gallops,no edema  ?Respiratory:  clear to auscultation bilaterally, normal work of breathing ?GI: soft, nontender, nondistended, + BS ?MS: no deformity or atrophy  ?Skin: warm and dry ?Neuro:  Strength and sensation are intact ?Psych: euthymic mood, full affect ? ?EKG:  EKG is ordered  today. ?Personal review of the ekg ordered shows sinus rhythm, rate 64  ? ?Recent Labs: ?02/11/2021: BUN 9; Creatinine, Ser 0.98; Hemoglobin 14.5; Platelets 200; Potassium 4.1; Sodium 136  ? ? ?Lipid Panel  ?No results found for: CHOL, TRIG, HDL, CHOLHDL, VLDL, LDLCALC, LDLDIRECT ? ? ?Wt Readings from Last 3 Encounters:  ?09/21/21 126 lb (57.2 kg)  ?06/12/21 128 lb 9.6 oz (58.3 kg)  ?04/01/21 127 lb 12.8 oz (58 kg)  ?  ? ? ?Other studies Reviewed: ?Additional studies/ records that were reviewed today include: TTE 06/30/16  ?Review of the above records today demonstrates:  ?- Left ventricle: The cavity size was normal. Systolic function was  ?  normal. The estimated ejection fraction was in the range of 55%  ?  to 60%. Wall motion was normal; there were no regional wall  ?  motion abnormalities. Left ventricular diastolic function  ?  parameters were normal.  ?-Left atrium:  The atrium was normal in size. ? ?ASSESSMENT AND PLAN: ? ?1.  Persistent atrial fibrillation: CHA2DS2-VASc of 1.  Status post ablation 03/04/2021.  Eliquis stopped at his last visit.  He has had no further episodes of atrial fibrillation.  He did have 2 episodes of tachycardia that terminated with Valsalva potentially due to SVT.  He is happy with his control. ? ?Current medicines are reviewed at length with the patient today.   ?The patient does not have concerns regarding his medicines.  The following changes were made today: None ? ?Labs/ tests ordered today include:  ?Orders Placed This Encounter  ?Procedures  ? EKG 12-Lead  ? ? ? ? ? ?Disposition:   FU with Darius Williamson 6 months ? ?Signed, ?Darius Silverio Meredith Leeds, MD  ?09/21/2021 4:21 PM    ? ?[facility] ?[address] ?[address] ?[place] [address] ?[phone] (office) ?[phone] (fax) ? ?

## 2022-03-29 ENCOUNTER — Ambulatory Visit: Payer: Medicare Other | Attending: Cardiology | Admitting: Cardiology

## 2022-03-29 ENCOUNTER — Encounter: Payer: Self-pay | Admitting: Cardiology

## 2022-03-29 VITALS — BP 126/60 | HR 57 | Ht 67.0 in | Wt 127.1 lb

## 2022-03-29 DIAGNOSIS — I471 Supraventricular tachycardia, unspecified: Secondary | ICD-10-CM | POA: Diagnosis present

## 2022-03-29 DIAGNOSIS — I4819 Other persistent atrial fibrillation: Secondary | ICD-10-CM | POA: Insufficient documentation

## 2022-03-29 NOTE — Patient Instructions (Signed)
Medication Instructions:  Your physician recommends that you continue on your current medications as directed. Please refer to the Current Medication list given to you today.  *If you need a refill on your cardiac medications before your next appointment, please call your pharmacy*   Lab Work: None ordered   Testing/Procedures: None ordered   Follow-Up: At [facility], you and your health needs are our priority.  As part of our continuing mission to provide you with exceptional heart care, we have created designated Provider Care Teams.  These Care Teams include your primary Cardiologist (physician) and Advanced Practice Providers (APPs -  Physician Assistants and Nurse Practitioners) who all work together to provide you with the care you need, when you need it.  Your next appointment:   1 year(s)  The format for your next appointment:   In Person  Provider:   Will Camnitz, MD    Thank you for choosing [facility]!!   Durelle Zepeda, RN [phone]  Other Instructions   Important Information About Sugar           

## 2022-03-29 NOTE — Progress Notes (Signed)
Electrophysiology Office Note   Date:  03/29/2022   ID:  Demetrios Byron, DOB 26-Feb-1951, MRN [id number]  PCP:  Patrecia Pour, Christean Grief, MD  Cardiologist:   Primary Electrophysiologist:  Keirston Saephanh Meredith Leeds, MD    Chief Complaint: AF   History of Present Illness: Darius Williamson is a 71 y.o. male who is being seen today for the evaluation of AF at the request of Doreatha Lew, MD. Presenting today for electrophysiology evaluation.  He has a history significant for atrial fibrillation.  He presented in 2017 with palpitations and weakness.  He has infrequent episodes of atrial fibrillation, but his atrial fibrillation then became persistent.  He is now status post ablation 03/04/2021.  Today, denies symptoms of palpitations, chest pain, shortness of breath, orthopnea, PND, lower extremity edema, claudication, dizziness, presyncope, syncope, bleeding, or neurologic sequela. The patient is tolerating medications without difficulties.  He has been feeling well.  He has had episodes of SVT.  1 month ago he restarted his metoprolol.  He has had no further episodes.   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     ascorbic acid (VITAMIN C) 1000 MG tablet Take 1,000 mg by mouth daily.     Cholecalciferol (VITAMIN D) 50 MCG (2000 UT) tablet Take 4,000 Units by mouth daily.     Cyanocobalamin (VITAMIN B-12 PO) Take 1 tablet by mouth daily.      fexofenadine (ALLEGRA) 180 MG tablet Take 180 mg by mouth daily as needed for allergies.     Magnesium 250 MG TABS Take 250 mg by mouth daily.     metoprolol tartrate (LOPRESSOR) 25 MG tablet Take 25 mg by mouth 2 (two) times daily.     Misc Natural Product Nasal (NASAL CLEANSE RINSE MIX NA) Place 1 Dose into the nose daily as needed (congestion).     Multiple Vitamin (MULTI-VITAMIN) tablet Take 1 tablet by mouth daily.     sildenafil (REVATIO) 20 MG tablet Take 20 mg by mouth daily as needed (Erectile dysfunction).     No current facility-administered medications for this visit.    Allergies:   Patient has no known allergies.   Social History:  The patient  reports that he has never smoked. He has never used smokeless tobacco. He reports that he does not currently use alcohol after a past usage of about 2.0 standard drinks of alcohol per week. He reports that he does not use drugs.   Family History:  The patient's family history includes Stroke in his mother.   ROS:  Please see the history of present illness.   Otherwise, review of systems is positive for none.   All other systems are reviewed and negative.   PHYSICAL EXAM: VS:  BP 126/60   Pulse (!) 57   Ht 5\' 7"  (1.702 m)   Wt 127 lb 1.9 oz (57.7 kg)   SpO2 98%   BMI 19.91 kg/m  ,  BMI Body mass index is 19.91 kg/m. GEN: Well nourished, well developed, in no acute distress  HEENT: normal  Neck: no JVD, carotid bruits, or masses Cardiac: RRR; no murmurs, rubs, or gallops,no edema  Respiratory:  clear to auscultation bilaterally, normal work of breathing GI: soft, nontender, nondistended, + BS MS: no deformity or atrophy  Skin: warm and dry Neuro:  Strength and sensation are intact Psych: euthymic mood, full affect  EKG:  EKG is ordered today. Personal review of the ekg ordered shows sinus rhythm, right superior axis, rate 57  Recent Labs: No results found for requested labs within last 365 days.    Lipid Panel  No  results found for: "CHOL", "TRIG", "HDL", "CHOLHDL", "VLDL", "LDLCALC", "LDLDIRECT"   Wt Readings from Last 3 Encounters:  03/29/22 127 lb 1.9 oz (57.7 kg)  09/21/21 126 lb (57.2 kg)  06/12/21 128 lb 9.6 oz (58.3 kg)      Other studies Reviewed: Additional studies/ records that were reviewed today include: TTE 06/30/16  Review of the above records today demonstrates:  - Left ventricle: The cavity size was normal. Systolic function was    normal. The estimated ejection fraction was in the range of 55%    to 60%. Wall motion was normal; there were no regional wall    motion abnormalities. Left ventricular diastolic function    parameters were normal.  -Left atrium:  The atrium was normal in size.  ASSESSMENT AND PLAN:  1.  Persistent atrial fibrillation: CHA2DS2-VASc of 1.  Status post ablation 03/04/2021.  Eliquis has since been stopped.  He has had 2 episodes of tachycardia consistent with SVT.  He is overall happy with his control.  He remains in sinus rhythm.  No changes.  2.  SVT: Currently on metoprolol 25 mg twice daily.  Since restarting this medication, he has had no further episodes of SVT.  We Marvalene Barrett continue with current management.  Current medicines are reviewed at length with the patient today.   The patient does not have concerns regarding his medicines.  The following changes were made today: None  Labs/ tests ordered today include:  Orders Placed This Encounter  Procedures   EKG 12-Lead     Disposition:   FU with Verneda Hollopeter 12 months  Signed, Pate Aylward Jorja Loa, MD  03/29/2022 2:13 PM     [facility] [address] [place] [address] [phone] (office) [phone] (fax)

## 2023-04-04 ENCOUNTER — Ambulatory Visit: Payer: Medicare Other | Attending: Cardiology | Admitting: Cardiology

## 2023-04-04 ENCOUNTER — Encounter: Payer: Self-pay | Admitting: Cardiology

## 2023-04-04 VITALS — BP 100/60 | HR 57 | Ht 67.0 in | Wt 125.0 lb

## 2023-04-04 DIAGNOSIS — I4819 Other persistent atrial fibrillation: Secondary | ICD-10-CM | POA: Diagnosis not present

## 2023-04-04 DIAGNOSIS — I471 Supraventricular tachycardia, unspecified: Secondary | ICD-10-CM | POA: Insufficient documentation

## 2023-04-04 NOTE — Progress Notes (Signed)
  Electrophysiology Office Note:   Date:  04/04/2023  ID:  Linda Hedges, DOB [birth year], MRN [id number]  Primary Cardiologist: None Electrophysiologist: Elnore Cosens Jorja Loa, MD      History of Present Illness:   Darius Williamson is a 72 y.o. male with h/o atrial fibrillation post ablation 03/04/2021 seen today for routine electrophysiology followup.   Since last being seen in our clinic the patient reports doing overall well.  He has noted no further episodes of atrial fibrillation since his ablation.  He continues to have short episodes of SVT, but these are overall well-controlled by his metoprolol.  Episodes occur a few times a week and last for approximately 10 seconds.  he denies chest pain, palpitations, dyspnea, PND, orthopnea, nausea, vomiting, dizziness, syncope, edema, weight gain, or early satiety.   Review of systems complete and found to be negative unless listed in HPI.   EP Information / Studies Reviewed:    EKG is ordered today. Personal review as below.  EKG Interpretation Date/Time:  Monday April 04 2023 13:38:55 EDT Ventricular Rate:  57 PR Interval:  208 QRS Duration:  106 QT Interval:  424 QTC Calculation: 412 R Axis:   -84  Text Interpretation: Sinus bradycardia Left anterior fascicular block Anteroseptal infarct (cited on or before 06-Jul-2016) When compared with ECG of 01-Apr-2021 10:02, No significant change since last tracing Confirmed by Raj Landress ([id number]) on 04/04/2023 1:40:13 PM     Risk Assessment/Calculations:    CHA2DS2-VASc Score = 1   This indicates a 0.6% annual risk of stroke. The patient's score is based upon: CHF History: 0 HTN History: 0 Diabetes History: 0 Stroke History: 0 Vascular Disease History: 0 Age Score: 1 Gender Score: 0              Physical Exam:   VS:  BP 100/60 (BP Location: Left Arm, Patient Position: Sitting, Cuff Size: Normal)   Pulse (!) 57   Ht 5\' 7"  (1.702 m)   Wt 125 lb (56.7 kg)   BMI 19.58 kg/m    Wt  Readings from Last 3 Encounters:  04/04/23 125 lb (56.7 kg)  03/29/22 127 lb 1.9 oz (57.7 kg)  09/21/21 126 lb (57.2 kg)     GEN: Well nourished, well developed in no acute distress NECK: No JVD; No carotid bruits CARDIAC: Regular rate and rhythm, no murmurs, rubs, gallops RESPIRATORY:  Clear to auscultation without rales, wheezing or rhonchi  ABDOMEN: Soft, non-tender, non-distended EXTREMITIES:  No edema; No deformity   ASSESSMENT AND PLAN:    1.  Persistent atrial fibrillation: Post ablation 03/04/2021.  Eliquis has been stopped.  Episodes of atrial fibrillation.  Tamilyn Lupien continue with current management.  2.  SVT: Currently on metoprolol.  Continues to have short episodes lasting approximately 10 seconds a few times a week.  He is minimally symptomatic.  No changes.  Follow up with EP APP in 12 months  Signed, Marializ Ferrebee Jorja Loa, MD

## 2023-05-21 IMAGING — CT CT HEART MORPH/PULM VEIN W/ CM & W/O CA SCORE
2 of 6 series · 12 of 20 positions shown, 14 images · IV contrast (Omni 300)
Comparison: None.
COMPARISON: None.

Addendum:
EXAM:
OVER-READ INTERPRETATION  CT CHEST

The following report is an over-read performed by radiologist Dr.
Deyon Xue [[email or web address]] on 02/25/2021. This
over-read does not include interpretation of cardiac or coronary
anatomy or pathology. The coronary calcium score/coronary CTA
interpretation by the cardiologist is attached.
CLINICAL DATA: Atrial fibrillation scheduled for ablation.
Cardiac CTA
TECHNIQUE: A non-contrast, gated CT scan was obtained with axial slices of 3 mm
through the heart for calcium scoring. Calcium scoring was performed
using the Agatston method. A 120 kV retrospective, gated, contrast
cardiac scan was obtained. Gantry rotation speed was 250 msecs and
collimation was 0.6 mm. Nitroglycerin was not given. A delayed scan
was obtained 60 seconds after contrast injection to exclude left
atrial appendage thrombus. The 3D dataset was reconstructed in 5%
intervals of the 0-95% of the R-R cycle. Systolic and diastolic
phases were analyzed on a dedicated workstation using MPR, MIP, and
VRT modes. The patient received 80 cc of contrast.

[Series 7: 0-90% · axial · 0.39mm/px · z∈[+1231,+1323]mm · 6 of 2570 slices shown, 8 images]
[im 368/2570  vessel]
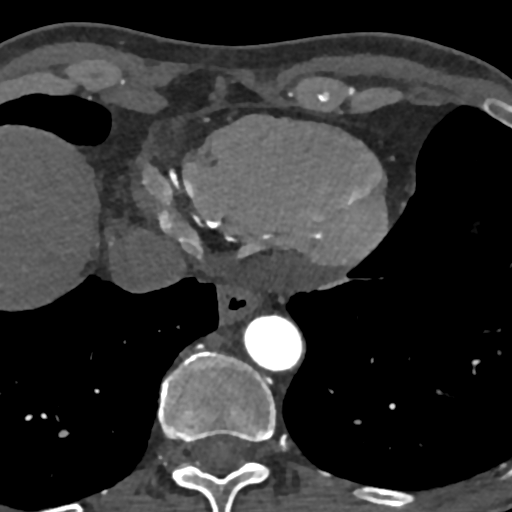
[im 368/2570  lung]
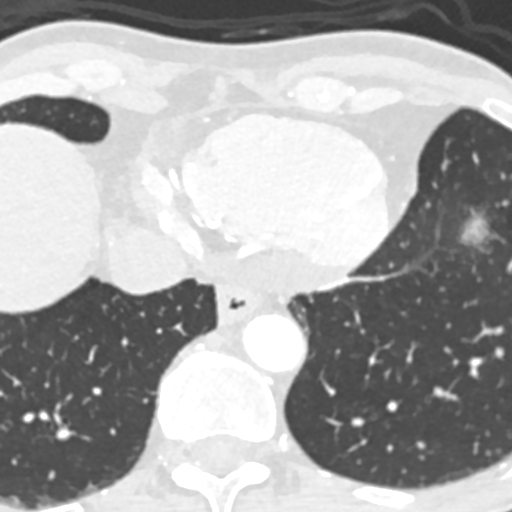
[im 735/2570  vessel]
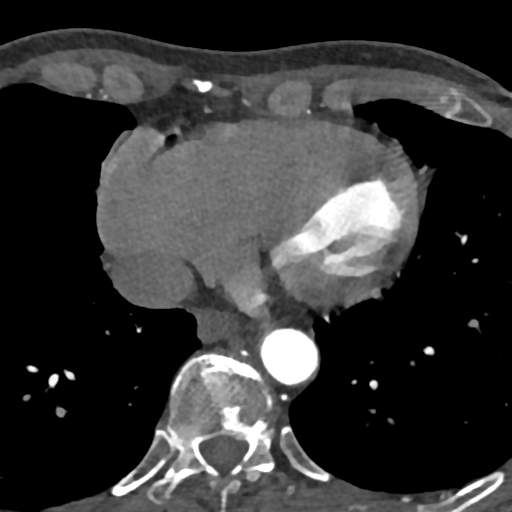
[im 1102/2570  vessel]
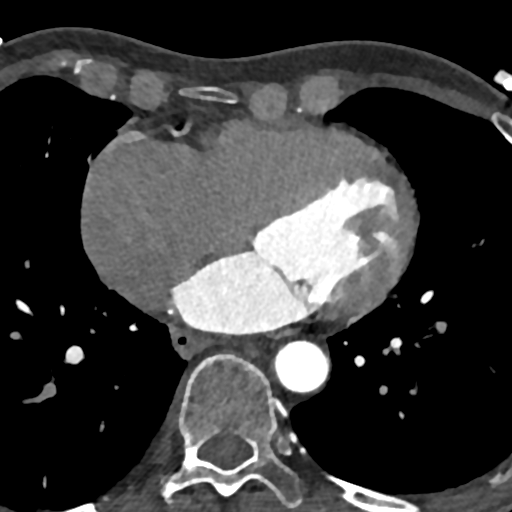
[im 1469/2570  vessel]
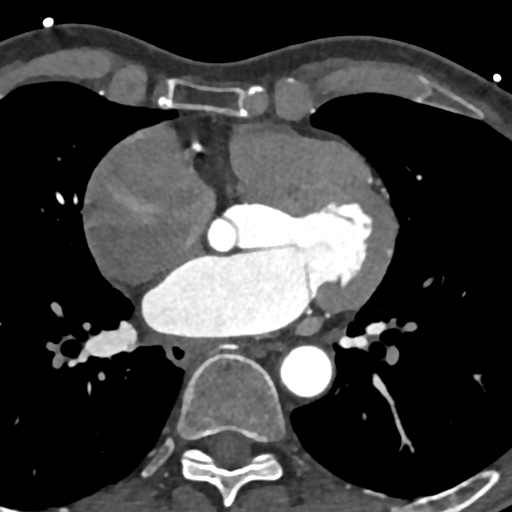
[im 1836/2570  vessel]
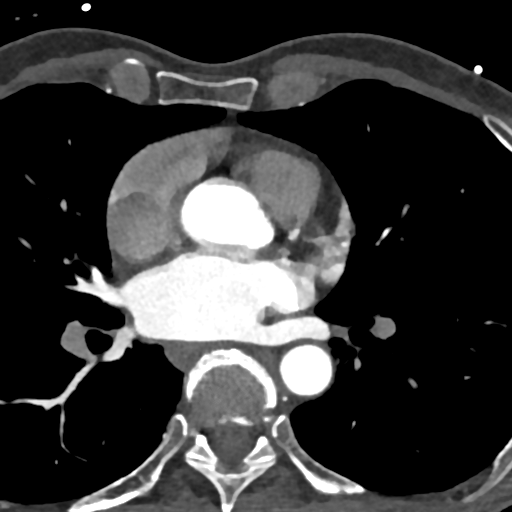
[im 1836/2570  lung]
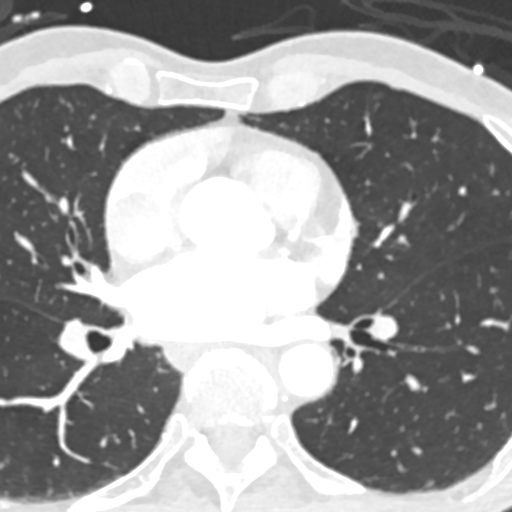
[im 2203/2570  vessel]
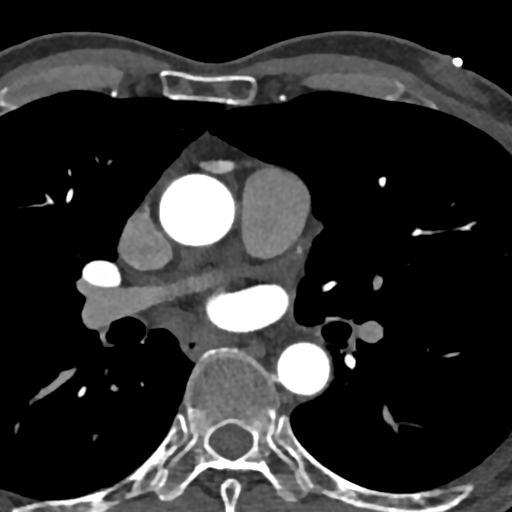

[Series 8: 5-95% · axial · 0.39mm/px · z∈[+1231,+1323]mm · 6 of 2570 slices shown]
[im 368/2570  vessel]
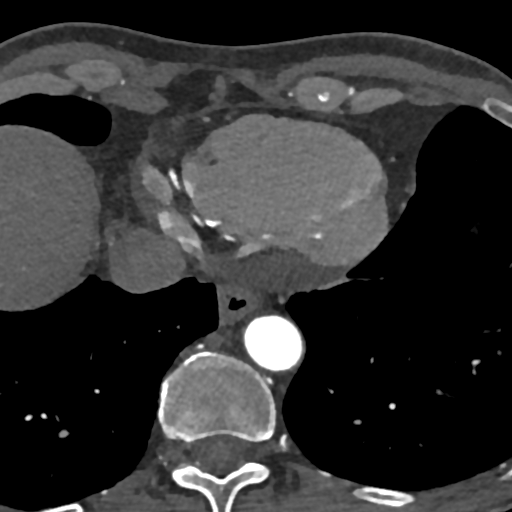
[im 735/2570  vessel]
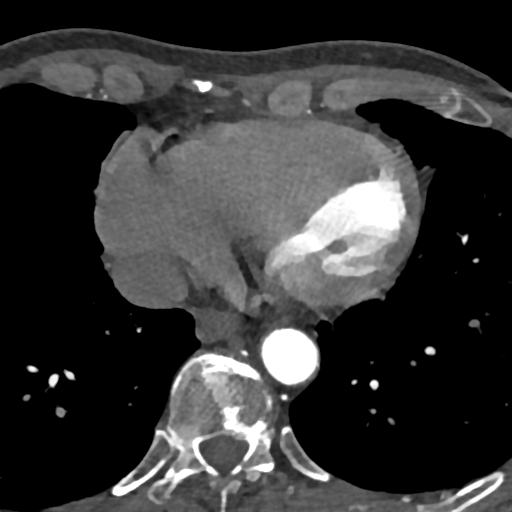
[im 1102/2570  vessel]
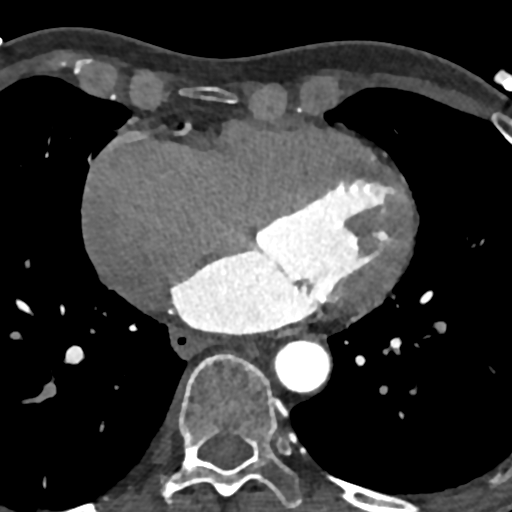
[im 1469/2570  vessel]
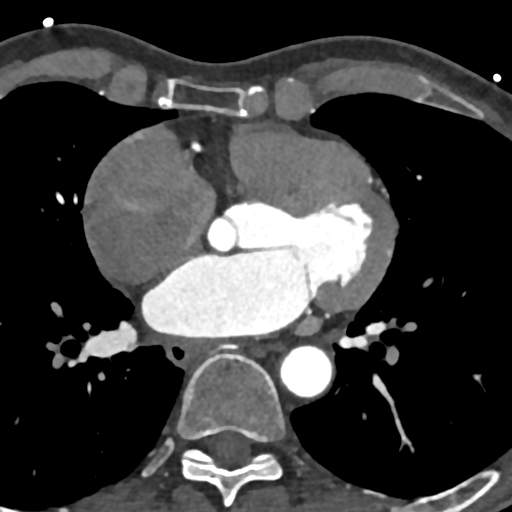
[im 1836/2570  vessel]
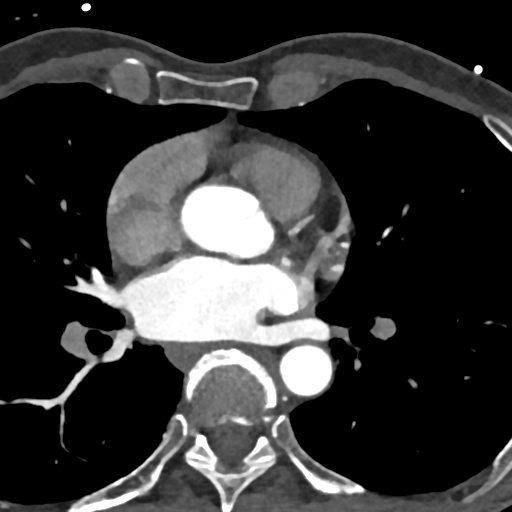
[im 2203/2570  vessel]
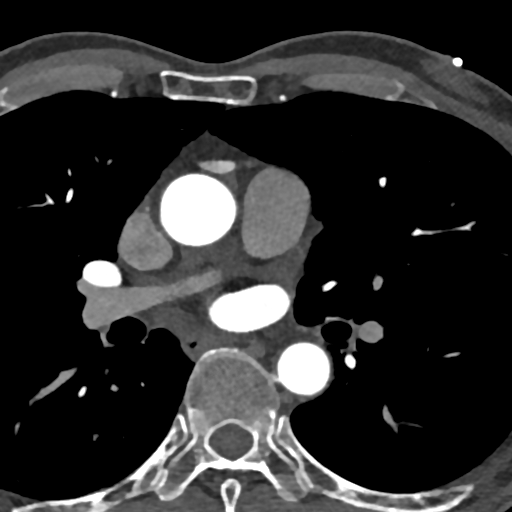

[12 of 20 positions shown; findings below may reference images not displayed]

FINDINGS: Within the visualized portions of the thorax there are no suspicious
appearing pulmonary nodules or masses, there is no acute
consolidative airspace disease, no pleural effusions, no
pneumothorax and no lymphadenopathy. Visualized portions of the
upper abdomen are unremarkable. There are no aggressive appearing
lytic or blastic lesions noted in the visualized portions of the
skeleton.
IMPRESSION: 1. No significant incidental noncardiac findings are noted.
FINDINGS: Image quality: Excellent

Noise artifact is: Limited

Pulmonary Veins: There is normal pulmonary vein drainage into the
left atrium (2 on the right and 2 on the left) with ostial
measurements as follows:

RUPV: Ostium 21 x 16 mm  area 2.65 cm2

RLPV:  Ostium 22 x 19 mm  area 3.33 cm2

LUPV:  Ostium 19 x 15 mm area 2.19 cm2

LLPV:  Ostium 17 x 11 mm  area 1.34 cm 2

Left Atrium: The left atrial size is normal. There is no PFO/ASD.
The left atrial appendage is large broccoli type with two lobes and
ostial size mm and length mm. There is no thrombus in the left
atrial appendage on contrast or delayed imaging. The esophagus runs
in the left atrial midline and is not in proximity to any of the
pulmonary vein ostia.

Coronary Arteries: CAC score of 0, which is 0 percentile for age-,
race-, and sex-matched controls. Normal coronary origin. Right
dominance. The study was performed without use of NTG and is
insufficient for plaque evaluation.

Right Atrium: Right atrial size is within normal limits.

Right Ventricle: The right ventricular cavity is within normal
limits.

Pericardium: Normal thickness with no significant effusion or
calcium present.

Pulmonary Artery: Normal caliber without proximal filling defect.

Cardiac valves: The aortic valve is trileaflet without significant
calcification. The mitral valve is normal structure without
significant calcification.

Aorta: Normal caliber with no significant disease.

Extra-cardiac findings: See attached radiology report for
non-cardiac structures.
IMPRESSION: 1. There is normal pulmonary vein drainage into the left atrium with
ostial measurements above.

2. There is no thrombus in the left atrial appendage.

3. The esophagus runs in the left atrial midline and is not in
proximity to any of the pulmonary vein ostia.

4. No PFO/ASD.

5. Normal coronary origin. Right dominance.

6. CAC score of 0 which is 0 percentile for age-, race-, and
sex-matched controls.

*** End of Addendum ***
EXAM:
OVER-READ INTERPRETATION  CT CHEST

The following report is an over-read performed by radiologist Dr.
Deyon Xue [REDACTED] on 02/25/2021. This
over-read does not include interpretation of cardiac or coronary
anatomy or pathology. The coronary calcium score/coronary CTA
interpretation by the cardiologist is attached.
FINDINGS: Within the visualized portions of the thorax there are no suspicious
appearing pulmonary nodules or masses, there is no acute
consolidative airspace disease, no pleural effusions, no
pneumothorax and no lymphadenopathy. Visualized portions of the
upper abdomen are unremarkable. There are no aggressive appearing
lytic or blastic lesions noted in the visualized portions of the
skeleton.
IMPRESSION: 1. No significant incidental noncardiac findings are noted.

## 2024-02-13 ENCOUNTER — Telehealth: Payer: Self-pay | Admitting: Cardiology

## 2024-02-13 NOTE — Telephone Encounter (Signed)
 Patient c/o Palpitations: STAT if patient c/o lightheadedness, shortness of breath, or chest pain  How long have you had palpitations/irregular HR/ Afib? Are you having the symptoms now?  Wife says patient finished working out and had an irregular HR. Every 5-10 beats he got a premature beat. Tachycardia on and off. Took 1 Metoprolol  tab and HR came back down to about 74 bpm. Patient denies pain or other symptoms. Just a little anxious with the irregular HR. Wife says she doesn't think it's anything urgent. 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 She just thinks his workout was a little too vigorous.   Are you currently experiencing lightheadedness, SOB or CP?  No   Do you have a history of afib (atrial fibrillation) or irregular heart rhythm?  Hx afib and SVT  Have you checked your BP or HR? (document readings if available):  Has not checked BP, generally runs low per wife.  Are you experiencing any other symptoms?  Tired

## 2024-02-13 NOTE — Telephone Encounter (Signed)
 Spoke with pt wife, retired MD, she reports after exercising today he developed an elevated heart rate. He has been off the metoprolol  for some time now. She reports his heart rate will elevated to 118-120 bpm. She reports his heart rhythm was irregular but states it was premature beats, it does not feel like a fib. He took 1/2 of the metoprolol  and his heart rate has come down. This was the first time he has had a problem since stopping the metoprolol . She would like to know what dr inocencio thinks. Does he need to restart metoprolol ? She reports he feels fine now just tired but is always tired after exercising. Aware will forward to dr inocencio.

## 2024-02-15 NOTE — Telephone Encounter (Signed)
 Forwarding to triage to call pt tomorrow and give advisement.

## 2024-02-16 NOTE — Telephone Encounter (Signed)
 Darius Soyla Lunger, MD to [facility] Triage   02/15/24  1:09 PM Can restart metoprolol  needs to follow-up in atrial fibrillation clinic  Left message for patient to call back.

## 2024-02-16 NOTE — Telephone Encounter (Signed)
 Call came straight to triage. Patient was informed of Dr. Inocencio advisement. Patient stated he had SVT and not A. FIB and this was verified by his wife who is a doctor. Patient will start taking metoprolol  and follow-up with Dr. Inocencio in October. Will send message to Dr. Inocencio, so he is aware.

## 2024-02-16 NOTE — Telephone Encounter (Signed)
 Spoke w/ patients wife, Braden - patient is rescheduled to see Daphne Barrack, NP on 8/25 for f/u on SVT episodes.

## 2024-02-16 NOTE — Telephone Encounter (Signed)
 Called patient back with Dr. Inocencio advisement to see patient sooner if he is wanting to do that, since patient has an appointment in October. Patient would like to be seen sooner if possible. Will send message to EP scheduler.

## 2024-02-17 NOTE — Progress Notes (Signed)
 Electrophysiology Office Note:   Date:  02/20/2024  ID:  Darius Williamson, DOB 03/27/1951, MRN [id number]  Primary Cardiologist: None Primary Heart Failure: None Electrophysiologist: Will Gladis Norton, MD      History of Present Illness:   Darius Williamson is a 73 y.o. male with h/o AF seen today for acute visit due to episodes of SVT.    Pt called Triage on 02/16/24 with reports of SVT. His wife is a physician (pediatrician) and felt it was not AF.  He was started on metoprolol  and planned to follow up in October but called back wanting to be seen sooner.   Patient reports he only had the one episode of elevated HR's. He was exercising and his HR became fast and would not slow down. He started the metoprolol  and then had no further episodes. He has since backed down to PRN use only and has not needed to use since.  He asks about what his target HR should be with exercise.  He feels fine when takes the metoprolol  but would like to avoid scheduled med if able. His wife asks about using cardizem instead if needed.   He denies chest pain, palpitations, dyspnea, PND, orthopnea, nausea, vomiting, dizziness, syncope, edema, weight gain, or early satiety.   Review of systems complete and found to be negative unless listed in HPI.   EP Information / Studies Reviewed:    EKG is ordered today. Personal review as below.  EKG Interpretation Date/Time:  Monday February 20 2024 08:06:41 EDT Ventricular Rate:  63 PR Interval:  212 QRS Duration:  108 QT Interval:  418 QTC Calculation: 427 R Axis:   -81  Text Interpretation: Sinus rhythm with 1st degree A-V block with occasional Premature ventricular complexes Left axis deviation Confirmed by Aniceto Jarvis ([id number]) on 02/20/2024 8:11:06 AM    Arrhythmia / AAD / Pertinent EP Studies AF EPS 03/04/21 > AF on presentation, PVI ablation with RF current, posterior wall ablation     Risk Assessment/Calculations:    CHA2DS2-VASc Score = 1   This indicates a  0.6% annual risk of stroke. The patient's score is based upon: CHF History: 0 HTN History: 0 Diabetes History: 0 Stroke History: 0 Vascular Disease History: 0 Age Score: 1 Gender Score: 0              Physical Exam:   VS:  BP 112/70   Pulse 63   Ht 5' 7 (1.702 m)   Wt 124 lb (56.2 kg)   SpO2 95%   BMI 19.42 kg/m    Wt Readings from Last 3 Encounters:  02/20/24 124 lb (56.2 kg)  04/04/23 125 lb (56.7 kg)  03/29/22 127 lb 1.9 oz (57.7 kg)     GEN: Well nourished, well developed in no acute distress NECK: No JVD; No carotid bruits CARDIAC: Regular rate and rhythm, no murmurs, rubs, gallops RESPIRATORY:  Clear to auscultation without rales, wheezing or rhonchi  ABDOMEN: Soft, non-tender, non-distended EXTREMITIES:  No edema; No deformity   ASSESSMENT AND PLAN:    SVT  PVC's -episodes rarely occur, maybe once per year and seem to be after cardio  -offered short term monitor but patient would like to hold off for now as no further episodes -change Metoprolol  to 25 mg PRN sustained HR >115 bpm  -discussed ER precautions  -no further episodes   Persistent Atrial Fibrillation CHA2DS2-VASc 1, s/p PVI + PW ablation 02/2021 -not on OAC as no AF burden post ablation  -consider [employer]  for monitoring  Follow up with Dr. Inocencio / EP APP 4 months  to reassess burden off metoprolol .   Signed, Daphne Barrack, NP-C, AGACNP-BC [facility] - Electrophysiology  02/20/2024, 8:12 AM

## 2024-02-20 ENCOUNTER — Ambulatory Visit: Attending: Pulmonary Disease | Admitting: Pulmonary Disease

## 2024-02-20 ENCOUNTER — Encounter: Payer: Self-pay | Admitting: Pulmonary Disease

## 2024-02-20 VITALS — BP 112/70 | HR 63 | Ht 67.0 in | Wt 124.0 lb

## 2024-02-20 DIAGNOSIS — I471 Supraventricular tachycardia, unspecified: Secondary | ICD-10-CM | POA: Diagnosis not present

## 2024-02-20 DIAGNOSIS — I4819 Other persistent atrial fibrillation: Secondary | ICD-10-CM | POA: Insufficient documentation

## 2024-02-20 MED ORDER — METOPROLOL TARTRATE 25 MG PO TABS: ORAL_TABLET | ORAL | 1 refills | Status: DC

## 2024-02-20 NOTE — Patient Instructions (Addendum)
 Medication Instructions:  Change your metoprolol  tartrate (Lopressor ) to 25 mg every 4 hours as needed for heart racing / HR sustained >115 bpm.  If you begin to have a cluster of episodes of fast rates / palpitations, go back on Metoprolol  12.5 mg BID and give us  a call. If you have more frequent episodes, we will review a short term heart monitor.   Target HR for exercise = 220-age x 80% >> your target HR is around 117 beats per minute  *If you need a refill on your cardiac medications before your next appointment, please call your pharmacy*  Lab Work: No lab work today If you have labs (blood work) drawn today and your tests are completely normal, you will receive your results only by: MyChart Message (if you have MyChart) OR A paper copy in the mail If you have any lab test that is abnormal or we need to change your treatment, we will call you to review the results.  Testing/Procedures: No testing/procedures were scheduled today  Follow-Up: At [facility], you and your health needs are our priority.  As part of our continuing mission to provide you with exceptional heart care, our providers are all part of one team.  This team includes your primary Cardiologist (physician) and Advanced Practice Providers or APPs (Physician Assistants and Nurse Practitioners) who all work together to provide you with the care you need, when you need it.  Your next appointment:   4 month(s)  Provider:   You may see Will Gladis Norton, MD or one of the following Advanced Practice Providers on your designated Care Team:    Daphne Barrack, NP    We recommend signing up for the patient portal called MyChart.  Sign up information is provided on this After Visit Summary.  MyChart is used to connect with patients for Virtual Visits (Telemedicine).  Patients are able to view lab/test results, encounter notes, upcoming appointments, etc.  Non-urgent messages can be sent to your provider as well.   To  learn more about what you can do with MyChart, go to [email or web address].

## 2024-03-16 ENCOUNTER — Telehealth: Payer: Self-pay | Admitting: Cardiology

## 2024-03-16 MED ORDER — METOPROLOL TARTRATE 25 MG PO TABS: 12.5000 mg | ORAL_TABLET | Freq: Two times a day (BID) | ORAL | Status: AC

## 2024-03-16 NOTE — Telephone Encounter (Signed)
 Spoke with the patient's wife who states that the patient has been having an increased burden of premature beats this week. She states that earlier in the week she felt his pulse and every 5-7 beats were irregular. She states that he was feeling jittery, anxious, and a bit disoriented. She states that his heart rate got has high as 130. Patient has restarted metoprolol  12.5mg  twice daily. The wife states that it has brought his heart rate down to the 80s but he is still having frequent extra beats. Patient is feeling fine today. Wife confirms that he is staying hydrated. No lightheadedness, dizziness, fatigue, shortness of breath or chest pain.  Per last office visit with Brandi a heart monitor was recommended but patient declined as he was not having episodes at that time. Wife would like to know if the patient needs to be seen or if adjustments need to be made to his medications. She states that she has a 6 month follow up appointment with Brandi on Tuesday next week and that she is doing fine so she is willing to give her appointment to her husband. Will send message to [facility] for further recommendations.

## 2024-03-16 NOTE — Telephone Encounter (Signed)
 Spoke with the patient's wife and advised on the recommendations per Brandi. She verbalized understanding.

## 2024-03-16 NOTE — Telephone Encounter (Signed)
  Patient c/o Palpitations:  STAT if patient reporting lightheadedness, shortness of breath, or chest pain  How long have you had palpitations/irregular HR/ Afib? Are you having the symptoms now? No   Are you currently experiencing lightheadedness, SOB or CP? No   Do you have a history of afib (atrial fibrillation) or irregular heart rhythm? Yes, SVT  Have you checked your BP or HR? (document readings if available): HR 80s, BP 102/60, 93/58   Are you experiencing any other symptoms? Anxiety and jittery, weak and tired     Patient's wife said, the patient premature beats is getting worst. He gets it in every 5-7 beats. She wants to know if this is normal or what is the dangerous parameters she need to look out for. Also, she felt like metoprolol  is not helping with the patient's premature beats

## 2024-03-20 ENCOUNTER — Ambulatory Visit: Attending: Pulmonary Disease

## 2024-03-20 ENCOUNTER — Telehealth: Payer: Self-pay | Admitting: Pulmonary Disease

## 2024-03-20 DIAGNOSIS — I493 Ventricular premature depolarization: Secondary | ICD-10-CM

## 2024-03-20 NOTE — Telephone Encounter (Signed)
 While in clinic for patient's wife's visit > they asked him to be seen as well. He reported he has again stopped his metoprolol  as he just can not tolerate it as he feels his blood pressure is too low. He states he frequently is checking his pulse and it is irregular. He does not have a wearable device to confirm rhythm. His wife is a retired Development worker, community and states he just can not take that medication.  We previously discussed a monitor to assess his PVC burden.  Will order a cardiac monitor for review.      Daphne Barrack, NP-C, AGACNP-BC [facility] - Electrophysiology  03/20/2024, 5:05 PM

## 2024-03-20 NOTE — Progress Notes (Unsigned)
Enrolled patient for a 7 day Zio XT monitor to be mailed to patients home  [place] to read

## 2024-03-23 ENCOUNTER — Telehealth: Payer: Self-pay | Admitting: Cardiology

## 2024-03-23 NOTE — Telephone Encounter (Signed)
 Wife Kurtis) called to report patient received his heart monitor today and placed it but it was defective.  Wife noted the company will be sending him a replacement.

## 2024-03-23 NOTE — Telephone Encounter (Signed)
 Spoke to patient's wife she wanted to make Daphne Barrack NP aware husband received a defective monitor.New monitor will be mailed.

## 2024-04-18 ENCOUNTER — Ambulatory Visit: Payer: Self-pay | Admitting: Pulmonary Disease

## 2024-04-18 DIAGNOSIS — I493 Ventricular premature depolarization: Secondary | ICD-10-CM

## 2024-04-19 ENCOUNTER — Ambulatory Visit: Admitting: Cardiology

## 2024-05-02 NOTE — Progress Notes (Unsigned)
  Electrophysiology Office Note:   Date:  05/03/2024  ID:  Darius Williamson, DOB 1950-12-14, MRN [id number]  Primary Cardiologist: None Primary Heart Failure: None Electrophysiologist: Will Gladis Norton, MD      History of Present Illness:   Darius Williamson is a 73 y.o. male with h/o AF, SVT seen today for routine electrophysiology followup.   Since last being seen in our clinic the patient reports doing well overall. He has not needed to use his PRN metoprolol . He reports after he wore the cardiac monitor it was as if everything quieted down and he has not had a significant burden of palpitations.   He denies chest pain, palpitations, dyspnea, PND, orthopnea, nausea, vomiting, dizziness, syncope, edema, weight gain, or early satiety.   Review of systems complete and found to be negative unless listed in HPI.   EP Information / Studies Reviewed:    EKG is not ordered today. EKG from 02/20/24 reviewed which showed NSR 63 bpm with 1st degree AVB      Arrhythmia / AAD / Pertinent EP Studies AF EPS 03/04/21 > AF on presentation, PVI ablation with RF current, posterior wall ablation   Cardiac Monitor [id number] > low burden of SVT, NSVT, no AF, rare ventricular ectopy, symptom trigger episodes correspond to SR   Risk Assessment/Calculations:    CHA2DS2-VASc Score = 1   This indicates a 0.6% annual risk of stroke. The patient's score is based upon: CHF History: 0 HTN History: 0 Diabetes History: 0 Stroke History: 0 Vascular Disease History: 0 Age Score: 1 Gender Score: 0              Physical Exam:   VS:  BP 112/74   Pulse (!) 59   Ht 5' 7 (1.702 m)   Wt 125 lb (56.7 kg)   SpO2 96%   BMI 19.58 kg/m    Wt Readings from Last 3 Encounters:  05/03/24 125 lb (56.7 kg)  02/20/24 124 lb (56.2 kg)  04/04/23 125 lb (56.7 kg)     GEN: thin, elderly male, well nourished, well developed in no acute distress NECK: No JVD; No carotid bruits CARDIAC: Regular rate and rhythm, no murmurs,  rubs, gallops RESPIRATORY:  Clear to auscultation without rales, wheezing or rhonchi  ABDOMEN: Soft, non-tender, non-distended EXTREMITIES:  No edema; No deformity   ASSESSMENT AND PLAN:    SVT  PVC's -recent monitor with low burden of SVT / NSVT  -Metoprolol  12.5 mg BID PRN palpitations  -discussed use of cardizem PRN in the future or scheduled dose if he feels persistently fatigued on metoprolol  > he does not want to make a change currently  Persistent Atrial Fibrillation  CHA2DS2-VASc 1, s/p PVI +PW ablation 02/2021  -not on OAC as no AF burden post ablation -monitors with kardia mobile    Follow up with Dr. Norton in 6 months   Signed, Daphne Barrack, NP-C, AGACNP-BC [facility] - Electrophysiology  05/03/2024, 4:03 PM

## 2024-05-03 ENCOUNTER — Encounter: Payer: Self-pay | Admitting: Pulmonary Disease

## 2024-05-03 ENCOUNTER — Ambulatory Visit: Attending: Pulmonary Disease | Admitting: Pulmonary Disease

## 2024-05-03 VITALS — BP 112/74 | HR 59 | Ht 67.0 in | Wt 125.0 lb

## 2024-05-03 DIAGNOSIS — I4819 Other persistent atrial fibrillation: Secondary | ICD-10-CM | POA: Diagnosis present

## 2024-05-03 DIAGNOSIS — I493 Ventricular premature depolarization: Secondary | ICD-10-CM | POA: Diagnosis present

## 2024-05-03 DIAGNOSIS — I471 Supraventricular tachycardia, unspecified: Secondary | ICD-10-CM | POA: Insufficient documentation

## 2024-05-03 NOTE — Patient Instructions (Signed)
 Medication Instructions:  No medications changes today   *If you need a refill on your cardiac medications before your next appointment, please call your pharmacy*  Lab Work: No lab work today If you have labs (blood work) drawn today and your tests are completely normal, you will receive your results only by: MyChart Message (if you have MyChart) OR A paper copy in the mail If you have any lab test that is abnormal or we need to change your treatment, we will call you to review the results.  Testing/Procedures: No testing/procedures were scheduled today  Follow-Up: At [facility], you and your health needs are our priority.  As part of our continuing mission to provide you with exceptional heart care, our providers are all part of one team.  This team includes your primary Cardiologist (physician) and Advanced Practice Providers or APPs (Physician Assistants and Nurse Practitioners) who all work together to provide you with the care you need, when you need it.  Your next appointment:   6 month(s)  Provider:   You may see Will Gladis Norton, MD or one of the following Advanced Practice Providers on your designated Care Team:    Daphne Barrack, NP    We recommend signing up for the patient portal called MyChart.  Sign up information is provided on this After Visit Summary.  MyChart is used to connect with patients for Virtual Visits (Telemedicine).  Patients are able to view lab/test results, encounter notes, upcoming appointments, etc.  Non-urgent messages can be sent to your provider as well.   To learn more about what you can do with MyChart, go to [email or web address].

## 2024-06-01 ENCOUNTER — Ambulatory Visit: Admitting: Pulmonary Disease

## 2024-06-01 ENCOUNTER — Ambulatory Visit: Admitting: Cardiology

## 2024-11-13 ENCOUNTER — Ambulatory Visit: Admitting: Cardiology
